# Patient Record
Sex: Male | Born: 1990 | Race: White | Hispanic: No | Marital: Single | Smoking: Current every day smoker
Health system: Southern US, Community
[De-identification: ages and names within clinical notes are randomized; demographics above are authoritative.]

## PROBLEM LIST (undated history)

## (undated) DIAGNOSIS — F909 Attention-deficit hyperactivity disorder, unspecified type: Secondary | ICD-10-CM

## (undated) HISTORY — PX: APPENDECTOMY: SHX54

---
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
## 2014-02-17 ENCOUNTER — Emergency Department (HOSPITAL_COMMUNITY)
Admission: EM | Admit: 2014-02-17 | Discharge: 2014-02-17 | Disposition: A | Discharge status: Home / Self Care | Payer: Self-pay | Attending: Emergency Medicine | Admitting: Emergency Medicine

## 2014-02-17 ENCOUNTER — Encounter (HOSPITAL_COMMUNITY): Payer: Self-pay | Admitting: Emergency Medicine

## 2014-02-17 DIAGNOSIS — K047 Periapical abscess without sinus: Secondary | ICD-10-CM

## 2014-02-17 DIAGNOSIS — K0381 Cracked tooth: Secondary | ICD-10-CM | POA: Insufficient documentation

## 2014-02-17 DIAGNOSIS — Z720 Tobacco use: Secondary | ICD-10-CM | POA: Insufficient documentation

## 2014-02-17 MED ORDER — OXYCODONE-ACETAMINOPHEN 5-325 MG PO TABS
1.0000 | ORAL_TABLET | Freq: Once | ORAL | Status: AC
  Administered 2014-02-17: 1 via ORAL
  Filled 2014-02-17: qty 1

## 2014-02-17 MED ORDER — IBUPROFEN 800 MG PO TABS
800.0000 mg | ORAL_TABLET | Freq: Once | ORAL | Status: AC
Start: 2014-02-17 — End: 2014-02-17
  Administered 2014-02-17: 800 mg via ORAL
  Filled 2014-02-17: qty 1

## 2014-02-17 MED ORDER — CLINDAMYCIN PHOSPHATE 600 MG/50ML IV SOLN
600.0000 mg | Freq: Once | INTRAVENOUS | Status: AC
  Administered 2014-02-17: 600 mg via INTRAVENOUS
  Filled 2014-02-17: qty 50

## 2014-02-17 MED ORDER — CLINDAMYCIN HCL 300 MG PO CAPS: 300.0000 mg | ORAL_CAPSULE | Freq: Four times a day (QID) | ORAL | Status: DC

## 2014-02-17 MED ORDER — CLINDAMYCIN PHOSPHATE 300 MG/2ML IJ SOLN
300.0000 mg | Freq: Once | INTRAMUSCULAR | Status: DC
  Filled 2014-02-17: qty 2

## 2014-02-17 MED ORDER — OXYCODONE-ACETAMINOPHEN 5-325 MG PO TABS: 1.0000 | ORAL_TABLET | ORAL | Status: DC | PRN

## 2014-02-17 NOTE — Discharge Instructions (Signed)
Dental Abscess A dental abscess is a collection of infected fluid (pus) from a bacterial infection in the inner part of the tooth (pulp). It usually occurs at the end of the tooth's root.  CAUSES   Severe tooth decay.  Trauma to the tooth that allows bacteria to enter into the pulp, such as a broken or chipped tooth. SYMPTOMS   Severe pain in and around the infected tooth.  Swelling and redness around the abscessed tooth or in the mouth or face.  Tenderness.  Pus drainage.  Bad breath.  Bitter taste in the mouth.  Difficulty swallowing.  Difficulty opening the mouth.  Nausea.  Vomiting.  Chills.  Swollen neck glands. DIAGNOSIS   A medical and dental history will be taken.  An examination will be performed by tapping on the abscessed tooth.  X-rays may be taken of the tooth to identify the abscess. TREATMENT The goal of treatment is to eliminate the infection. You may be prescribed antibiotic medicine to stop the infection from spreading. A root canal may be performed to save the tooth. If the tooth cannot be saved, it may be pulled (extracted) and the abscess may be drained.  HOME CARE INSTRUCTIONS  Only take over-the-counter or prescription medicines for pain, fever, or discomfort as directed by your caregiver.  Rinse your mouth (gargle) often with salt water ( tsp salt in 8 oz [250 ml] of warm water) to relieve pain or swelling.  Do not drive after taking pain medicine (narcotics).  Do not apply heat to the outside of your face.  Return to your dentist for further treatment as directed. SEEK MEDICAL CARE IF:  Your pain is not helped by medicine.  Your pain is getting worse instead of better. SEEK IMMEDIATE MEDICAL CARE IF:  You have a fever or persistent symptoms for more than 2-3 days.  You have a fever and your symptoms suddenly get worse.  You have chills or a very bad headache.  You have problems breathing or swallowing.  You have swelling in  the neck or around the eye. Document Released: 04/30/2005 Document Revised: 01/23/2012 Document Reviewed: 08/08/2010 [facility] Patient Information 2015 [place], [place]. This information is not intended to replace advice given to you by your health care provider. Make sure you discuss any questions you have with your health care provider.    Emergency Department Resource Guide 1) Find a Doctor and Pay Out of Pocket Although you won't have to find out who is covered by your insurance plan, it is a good idea to ask around and get recommendations. You will then need to call the office and see if the doctor you have chosen will accept you as a new patient and what types of options they offer for patients who are self-pay. Some doctors offer discounts or will set up payment plans for their patients who do not have insurance, but you will need to ask so you aren't surprised when you get to your appointment.  2) Contact Your Local Health Department Not all health departments have doctors that can see patients for sick visits, but many do, so it is worth a call to see if yours does. If you don't know where your local health department is, you can check in your phone book. The CDC also has a tool to help you locate your state's health department, and many state websites also have listings of all of their local health departments.  3) Find a Walk-in Clinic If your illness is not  likely to be very severe or complicated, you may want to try a walk in clinic. These are popping up all over the country in pharmacies, drugstores, and shopping centers. They're usually staffed by nurse practitioners or physician assistants that have been trained to treat common illnesses and complaints. They're usually fairly quick and inexpensive. However, if you have serious medical issues or chronic medical problems, these are probably not your best option.  No Primary Care Doctor: - Call Health Connect at  [phone] - they can help  you locate a primary care doctor that  accepts your insurance, provides certain services, etc. - Physician Referral Service- [phone]  Chronic Pain Problems: Organization         Address  Phone   Notes  [facility]  [phone] Patients need to be referred by their primary care doctor.   Medication Assistance: Organization         Address  Phone   Notes  [facility] Medication [facility] [address] [place], [place] [address] [phone] --Must be a resident of [facility] -- Must have NO insurance coverage whatsoever (no Medicaid/ Medicare, etc.) -- The pt. MUST have a primary care doctor that directs their care regularly and follows them in the community   MedAssist  [phone]   [employer]  [phone]    Agencies that provide inexpensive medical care: Organization         Address  Phone   Notes  [facility]  [phone]   [facility]    [phone]   [facility] [address] [place], [place] [address] [phone]   [facility] [address] [place] [place] [phone]   Planned Parenthood    [phone]   [facility]    [phone]   [facility]  [address], [place] Phone:  [phone], Fax:  [phone] Hours of Operation:  9 am - 6 pm, M-F.  Also accepts Medicaid/Medicare and self-pay.  [facility]  [address], [place] Phone: [phone], Fax: [phone]. Hours of Operation:  8:30 am - 5:30 pm, M-F.  Also accepts Medicaid and self-pay.  [facility] [place] [address], [place] Phone: [phone]   [facility] [address] Natasha Bence [place], [place] [phone], Ext. [phone] Mondays & Thursdays: 7-9 AM.  First 15 patients are seen on a first come, first serve basis.    Medicaid-accepting [facility] Providers:  Organization         Address  Phone   Notes  [facility] [address], [place] [phone] Also accepts self-pay patients.  [facility] [address] Laurell Josephs [place], [place]  [phone]   [facility] [address], [place] [phone]   [facility] [address] [place] [phone]   Renaye Rakers [address], [place]   [phone] Only accepts [place] Access [place] patients after they have their name applied to their card.   Self-Pay (no insurance) in [facility]:  Organization         Address  Phone   Notes  Sickle Cell Patients, [facility] [address] [place], [place] [phone]   Redge Gainer  [facility] [address], [place] [phone]) [phone]   [facility] [place]  [address], [place] [phone]   [facility]/Dr. Osei-Bonsu  [address] [place] [address], [place] or [phone], [place] [phone] Phone number for both [place] and [place] locations is the same.  [facility] [address], [place] [phone]   [facility] [address] [place] [address], [place] or [address] [phone]   [facility] [address], [place] [phone]) [phone], phone; [phone]) [phone], fax Sees patients 1st and 3rd Saturday of every month.  Must not qualify for public or private insurance (i.e. Medicaid, Medicare, [place] Health Choice, Veterans' Benefits)  Household income should be no more than 200% of the poverty level The clinic cannot treat you if you are pregnant or think you are pregnant  Sexually transmitted diseases are not treated at the clinic.    Dental Care: Organization         Address  Phone  Notes  [facility]  [facility] [address], [place] [phone]) [phone] Accepts children up to age 421 who are enrolled in [place] or [place] Health Choice; pregnant women with a Medicaid card; and children who have applied for Medicaid or [place] Health Choice, but were declined, whose parents can pay a reduced fee at time of service.  [facility] [place]  [address], [place] [phone]) [phone] Accepts children up to age 21 who are enrolled in [place] or [place] Health Choice; pregnant women with a Medicaid card; and children who have applied for Medicaid or [place] Health Choice, but were declined, whose parents can pay a reduced fee at time of service.  [facility]  [address], [place] [phone]) [phone] Patients are seen by appointment only. Walk-ins are not accepted. [facility] will see patients 10018 years of age and older. Monday - Tuesday (8am-5pm) Most Wednesdays (8:30-5pm) $30 per visit, cash only  [facility]  [address], [place]construct[place] [phone]) [phone] Patients are seen by appointment only. Walk-ins are not accepted. [facility] will see patients 21 years of age and older. One Wednesday Evening (Monthly: Volunteer Based).  $30 per visit, cash only  [employer] [facility] [facility]  [phone] for adults; Children under age 684, call Graduate Pediatric Dentistry at [phone]. Children aged 94-14, please call [phone] to request a pediatric application.  Dental services are provided in all areas of dental care including fillings, crowns and bridges, complete and partial dentures, implants, gum treatment, root canals, and extractions. Preventive care is also provided. Treatment is provided to both adults and children. Patients are selected via a lottery and there is often a waiting list.   [facility] [address], [place]  [phone] [email or web address]   [facility] [address], [place], [place] [phone], Ext. [phone] Second and Fourth Thursday of each month, opens at 6:30 AM; Clinic ends at 9 AM.  Patients are seen on a first-come first-served basis, and a limited number are seen during each clinic.   [facility]  [address], [place], [place] [phone]   Eligibility Requirements You must have lived in [place], [place], or [place] counties for at least the last three  months.   You cannot be eligible for state or federal sponsored [place], including [employer], [place], or [employer].   You generally cannot be eligible for healthcare insurance through your employer.    How to apply: Eligibility screenings are held every Tuesday and Wednesday afternoon from 1:00 pm until 4:00 pm. You do not need an appointment for the interview!  [facility] [address] [place], [place] [phone]   [facility]  [phone]   [facility] Health Department  [phone]   [facility] Health Department  [phone]    Behavioral Health Resources in the Community: Intensive Outpatient Programs Organization         Address  Phone  Notes  [facility] [address], [place], [place] [phone]   [facility]patient [address], [place], [place] [phone]   [facility] [address] [place], [place]  [phone]   [facility] [address],  [place], [place] [phone] or [phone]   Substance Abuse Resources Organization         Address  Phone  Notes  Al[facility] Drug Services  [phone]   [facility]  [phone]   [facility]  [phone]   Floydene Flock  [phone]   Residential & Outpatient Substance Abuse Program  2104669322   Psychological Services Organization         Address  Phone  Notes  [facility] [place] [facility]  [phone]   [facility]  [phone]   [facility] [address] [place] [phone] or [phone]    Mobile Crisis Teams Organization         Address  Phone  Notes  [facility], Mobile Crisis Care Unit  [phone]   [facility]  [address] [place], [place] [phone]   Doristine Locks [address] [place] [phone]    Self-Help/Support Groups Organization         Address  Phone             Notes  [facility] [place] - variety of support groups  336- I7437963 Call for more information  Narcotics Anonymous (NA), [facility] [address], [employer] [place]  2 meetings at this location   Statistician         Address  Phone  Notes  [facility] [address],    [place]  [phone]   [facility]  [address], [place] [address], [place], [place] [phone]   [facility] [address] [place], [place] [phone] Admissions: 8am-3pm M-F  [facility] [address]    [place], [place] [phone]   [facility] [address] [place], [place], [place] [phone]   [facility] [address],  [place], [place] [phone]   [facility] - Intensive Outpatient [address] Laurell Josephs [address], [place], [place] [phone]   [facility] [address] [place].,  [place], [place] [phone] or [phone]   [facility] ([facility]) [address] [place], [place] [phone] Accepts Medicaid  [facility] [address]  [place] [phone] Substance Abuse/Addiction Treatment   [facility] Organization         Address  Phone  Notes  [facility]  [phone]   Angie Fava, PhD  [address] [place]   [phone] or  [phone]   [facility]   [address] [place], [place] [phone]   [facility] [address] [place], [place] [phone] Insurance/Medicaid/sponsorship through [facility] [address]                                    [place], [place] [phone] Therapy/tele-psych/case  [facility] [address]   [place], [place] [phone]    Dr. Lolly Mustache  [phone]   [facility]  [employer] [facility] Dept. 1) [address] [place] 2) [address] [place] 3)  [address], [place] [phone]  [phone]   [facility] Child Abuse Hotline 434 288 1468 or [phone] (After Hours)         Complete your entire course of antibiotics as prescribed.  You  may use the oxycodone for pain relief but do not drive within 4 hours of taking as this will make you drowsy.  Avoid applying heat or ice to this abscess area which can worsen your symptoms.  You may use warm salt water swish and spit treatment or half peroxide and water swish and spit after meals to keep this area clean as discussed.

## 2014-02-17 NOTE — ED Notes (Signed)
Pt notice swolling of upper gums and pain, on left side, yesterday. Today face is swollen, some difficulty with eating, denies, any problems breathing or swallowing.

## 2014-02-18 NOTE — ED Provider Notes (Signed)
CSN: [id number]     Arrival date & time 02/17/14  1448 History   First MD Initiated Contact with Patient 02/17/14 1617     Chief Complaint  Patient presents with  . Dental Pain  . Facial Swelling     (Consider location/radiation/quality/duration/timing/severity/associated sxs/prior Treatment) The history is provided by the patient.   Murrell ConverseDustin G Bala is a 23 y.o. male presenting with dental pain and swelling.  He has had a fractured tooth for at least some months which has started causing pain yesterday.  The pain and swelling radiates along the left side of his nose and is constant and aching.  He has taken tylenol without relief of pain.  He denies fevers or chills, nausea or myalgias.  He has been able to eat but with some discomfort.     History reviewed. No pertinent past medical history. Past Surgical History  Procedure Laterality Date  . Appendectomy     History reviewed. No pertinent family history. History  Substance Use Topics  . Smoking status: Current Every Day Smoker -- 0.50 packs/day    Types: Cigarettes  . Smokeless tobacco: Not on file  . Alcohol Use: No    Review of Systems  Constitutional: Negative for fever.  HENT: Positive for dental problem and facial swelling. Negative for congestion and sore throat.   Eyes: Negative.   Respiratory: Negative for chest tightness and shortness of breath.   Cardiovascular: Negative for chest pain.  Gastrointestinal: Negative for nausea and abdominal pain.  Genitourinary: Negative.   Musculoskeletal: Negative for arthralgias, joint swelling, neck pain and neck stiffness.  Skin: Negative.  Negative for rash and wound.  Neurological: Negative for dizziness, weakness, light-headedness, numbness and headaches.  Psychiatric/Behavioral: Negative.       Allergies  Review of patient's allergies indicates not on file.  Home Medications   Prior to Admission medications   Medication Sig Start Date End Date Taking?  Authorizing Provider  acetaminophen (PAIN RELIEVER) 325 MG tablet Take 650 mg by mouth every 6 (six) hours as needed for mild pain or moderate pain.   Yes Historical Provider, MD  doxylamine, Sleep, (SLEEP AID) 25 MG tablet Take 25 mg by mouth at bedtime as needed for sleep.   Yes Historical Provider, MD  Multiple Vitamins-Minerals (MULTIVITAMIN GUMMIES ADULT PO) Take 1-2 each by mouth daily.   Yes Historical Provider, MD  clindamycin (CLEOCIN) 300 MG capsule Take 1 capsule (300 mg total) by mouth 4 (four) times daily. 02/17/14   Burgess AmorJulie Alanda Colton, PA-C  oxyCODONE-acetaminophen (PERCOCET/ROXICET) 5-325 MG per tablet Take 1 tablet by mouth every 4 (four) hours as needed. 02/17/14   Burgess AmorJulie Kazimierz [place], PA-C   BP 112/64  Pulse 81  Temp(Src) 98 F (36.7 C) (Oral)  Resp 24  Wt 120 lb (54.432 kg)  SpO2 100% Physical Exam  Constitutional: He is oriented to person, place, and time. He appears well-developed and well-nourished. No distress.  HENT:  Head: Normocephalic and atraumatic.  Right Ear: Tympanic membrane and external ear normal.  Left Ear: Tympanic membrane and external ear normal.  Mouth/Throat: Oropharynx is clear and moist and mucous membranes are normal. No oral lesions. Dental abscesses present.    Pt with mild left upper lip induration which tracks along the left nasolabial fold.  No fluctuance.  No facial erythema.  Mild edema without induration or erythema of left lower lid.    Eyes: Conjunctivae and EOM are normal.  Neck: Normal range of motion. Neck supple.  Cardiovascular: Normal rate and  normal heart sounds.   Pulmonary/Chest: Effort normal.  Abdominal: He exhibits no distension.  Musculoskeletal: Normal range of motion.  Lymphadenopathy:    He has no cervical adenopathy.  Neurological: He is alert and oriented to person, place, and time.  Skin: Skin is warm and dry. No erythema.  Psychiatric: He has a normal mood and affect.    ED Course  Procedures (including critical care  time) Labs Review Labs Reviewed - No data to display  Imaging Review No results found.   EKG Interpretation None      MDM   Final diagnoses:  Dental abscess    Pt given IV clindamycin.  Ibuprofen and oxycodone given with significant relief of pain.  He was prescribed oxy and clinda.  Advised needs dental f/u.  Referrals given.  In the interim,  Return here for any worsened sx.  Pt and mother at bedside agree and understand plan.    Burgess Amor, PA-C 02/18/14 1535

## 2014-02-18 NOTE — ED Provider Notes (Signed)
Medical screening examination/treatment/procedure(s) were performed by non-physician practitioner and as supervising physician I was immediately available for consultation/collaboration.   EKG Interpretation None        Ariana Juul L Roxann Vierra, MD 02/18/14 1539 

## 2015-12-22 ENCOUNTER — Emergency Department (HOSPITAL_COMMUNITY)
Admission: EM | Admit: 2015-12-22 | Discharge: 2015-12-22 | Disposition: A | Discharge status: Home / Self Care | Payer: Self-pay | Attending: Emergency Medicine | Admitting: Emergency Medicine

## 2015-12-22 ENCOUNTER — Encounter (HOSPITAL_COMMUNITY): Payer: Self-pay | Admitting: Emergency Medicine

## 2015-12-22 DIAGNOSIS — Z79899 Other long term (current) drug therapy: Secondary | ICD-10-CM | POA: Insufficient documentation

## 2015-12-22 DIAGNOSIS — K0889 Other specified disorders of teeth and supporting structures: Secondary | ICD-10-CM

## 2015-12-22 DIAGNOSIS — K047 Periapical abscess without sinus: Secondary | ICD-10-CM | POA: Insufficient documentation

## 2015-12-22 DIAGNOSIS — F17210 Nicotine dependence, cigarettes, uncomplicated: Secondary | ICD-10-CM | POA: Insufficient documentation

## 2015-12-22 MED ORDER — PENICILLIN V POTASSIUM 500 MG PO TABS: 500.0000 mg | ORAL_TABLET | Freq: Four times a day (QID) | ORAL | 0 refills | Status: AC

## 2015-12-22 NOTE — ED Notes (Signed)
Pt comes in today w/ complaints of L. Sided dental pain. Per pt pain started x2 days ago, and L.sided facial swelling began this morning 12/22/15. Swelling noted upon assessment. Pt denies any complications breathing, eating, or swallowing. P/t states he tried salt/water mouth rinse, Ibuprofen, and Tylenol w/ no relief. P/t AOx4, Vitals Stable. Resting w/ family at bedside.

## 2015-12-22 NOTE — ED Provider Notes (Signed)
[facility]-EMERGENCY DEPT Provider Note   CSN: [id number] Arrival date & time: 12/22/15  [id number]  First Provider Contact:  None       History   Chief Complaint Chief Complaint  Patient presents with  . Dental Pain    Left Side    HPI Scott Russo is a 25 y.o. male.  HPI   Patient is a 25 year old male with history of tobacco use who presents the emergency department with progressively worsening, constant, left upper sided jaw pain for 2 days. He is taking ibuprofen with moderate relief, pain worse with chewing or touch. Associated left-sided mild cheek swelling. Patient states previous dental infection in this area in the past. Patient endorses broken teeth in this area for over 2 years. Patient denies sore throat, difficulty swallowing, difficulty opening his mouth, fever, chills, nausea, vomiting.  History reviewed. No pertinent past medical history.  There are no active problems to display for this patient.   Past Surgical History:  Procedure Laterality Date  . APPENDECTOMY         Home Medications    Prior to Admission medications   Medication Sig Start Date End Date Taking? Authorizing Provider  acetaminophen (PAIN RELIEVER) 325 MG tablet Take 650 mg by mouth every 6 (six) hours as needed for mild pain or moderate pain.    Historical Provider, MD  doxylamine, Sleep, (SLEEP AID) 25 MG tablet Take 25 mg by mouth at bedtime as needed for sleep.    Historical Provider, MD  Multiple Vitamins-Minerals (MULTIVITAMIN GUMMIES ADULT PO) Take 1-2 each by mouth daily.    Historical Provider, MD  oxyCODONE-acetaminophen (PERCOCET/ROXICET) 5-325 MG per tablet Take 1 tablet by mouth every 4 (four) hours as needed. 02/17/14   Burgess AmorJulie Idol, PA-C  penicillin v potassium (VEETID) 500 MG tablet Take 1 tablet (500 mg total) by mouth 4 (four) times daily. 12/22/15 12/29/15  Jerre SimonJessica L Zaryah Seckel, PA    Family History History reviewed. No pertinent family history.  Social History Social History    Substance Use Topics  . Smoking status: Current Every Day Smoker    Packs/day: 0.50    Types: Cigarettes  . Smokeless tobacco: Former NeurosurgeonUser    Types: Chew    Quit date: 12/22/2010  . Alcohol use Yes     Comment: Per p/t social drinker unsure how much per week/month     Allergies   Review of patient's allergies indicates not on file.   Review of Systems Review of Systems  Constitutional: Negative for chills and fever.  HENT: Positive for dental problem. Negative for sore throat and trouble swallowing.   Gastrointestinal: Negative for abdominal pain, nausea and vomiting.  Neurological: Negative for headaches.     Physical Exam Updated Vital Signs BP 124/83 (BP Location: Left Arm)   Pulse 71   Temp 97.6 F (36.4 C) (Oral)   Resp 16   Ht 5\' 7"  (1.702 m)   Wt 56.7 kg   SpO2 100%   BMI 19.58 kg/m   Physical Exam  Constitutional: He appears well-developed and well-nourished. No distress.  HENT:  Head: Normocephalic and atraumatic.  Mouth/Throat: Uvula is midline, oropharynx is clear and moist and mucous membranes are normal. No trismus in the jaw. Abnormal dentition. Dental caries present. No dental abscesses or uvula swelling.  Poor dentition with numerous broken teeth and numerous cavities, broken upper left canine, first premolar, second premolar, erythema surrounding gingiva, mild edema, no area of fluctuance or drainable abscess, mild TTP, no trismus, no  sublingual swelling.  Eyes: Conjunctivae are normal.  Pulmonary/Chest: Effort normal. No respiratory distress.  Musculoskeletal: Normal range of motion.  Neurological: He is alert. Coordination normal.  Skin: Skin is warm and dry. He is not diaphoretic.  Psychiatric: He has a normal mood and affect. His behavior is normal.  Nursing note and vitals reviewed.    ED Treatments / Results  Labs (all labs ordered are listed, but only abnormal results are displayed) Labs Reviewed - No data to display  EKG  EKG  Interpretation None       Radiology No results found.  Procedures Procedures (including critical care time)  Medications Ordered in ED Medications - No data to display   Initial Impression / Assessment and Plan / ED Course  I have reviewed the triage vital signs and the nursing notes.  Pertinent labs & imaging results that were available during my care of the patient were reviewed by me and considered in my medical decision making (see chart for details).  Clinical Course   Patient with dentalgia.  No abscess requiring immediate incision and drainage.  Exam not concerning for Ludwig's angina or pharyngeal abscess.  Will treat with Penicillin. Pt instructed to follow-up with dentist.  Discussed return precautions. Pt safe for discharge and expressed understanding to the discharge instructions.   Final Clinical Impressions(s) / ED Diagnoses   Final diagnoses:  Pain, dental  Dental infection    New Prescriptions New Prescriptions   PENICILLIN V POTASSIUM (VEETID) 500 MG TABLET    Take 1 tablet (500 mg total) by mouth 4 (four) times daily.     Jerre Simon, PA 12/22/15 1054    Marily Memos, MD 12/24/15 [id number]

## 2015-12-22 NOTE — Discharge Instructions (Signed)
Take the penicillin as prescribed, be sure to complete the entire course, eat with this medication. If you experience rash or diarrhea discontinued this medication and return immediately to the emergency department. Follow-up with Dr. Mayford Knifeurner, dentist, to have your teeth reevaluated.  Return to emergency department if you experience worsening pain, worsening swelling, difficulty opening your mouth, fever, chills, vomiting, or any other concerning symptoms.

## 2015-12-22 NOTE — ED Notes (Signed)
Pt and Family given information on "[facility]". Verbalized understanding.

## 2017-07-17 ENCOUNTER — Other Ambulatory Visit: Payer: Self-pay

## 2017-07-17 ENCOUNTER — Encounter (HOSPITAL_COMMUNITY): Payer: Self-pay | Admitting: Emergency Medicine

## 2017-07-17 ENCOUNTER — Emergency Department (HOSPITAL_COMMUNITY)
Admission: EM | Admit: 2017-07-17 | Discharge: 2017-07-17 | Disposition: A | Discharge status: Home / Self Care | Payer: Self-pay | Attending: Emergency Medicine | Admitting: Emergency Medicine

## 2017-07-17 DIAGNOSIS — R42 Dizziness and giddiness: Secondary | ICD-10-CM | POA: Insufficient documentation

## 2017-07-17 DIAGNOSIS — F17210 Nicotine dependence, cigarettes, uncomplicated: Secondary | ICD-10-CM | POA: Insufficient documentation

## 2017-07-17 DIAGNOSIS — R1013 Epigastric pain: Secondary | ICD-10-CM

## 2017-07-17 DIAGNOSIS — R6883 Chills (without fever): Secondary | ICD-10-CM | POA: Insufficient documentation

## 2017-07-17 HISTORY — DX: Attention-deficit hyperactivity disorder, unspecified type: F90.9

## 2017-07-17 LAB — COMPREHENSIVE METABOLIC PANEL
ALK PHOS: 63 U/L (ref 38–126)
ALT: 17 U/L (ref 17–63)
ANION GAP: 13 (ref 5–15)
AST: 17 U/L (ref 15–41)
Albumin: 4.8 g/dL (ref 3.5–5.0)
BUN: 11 mg/dL (ref 6–20)
CALCIUM: 9.6 mg/dL (ref 8.9–10.3)
CO2: 21 mmol/L — AB (ref 22–32)
CREATININE: 0.79 mg/dL (ref 0.61–1.24)
Chloride: 104 mmol/L (ref 101–111)
GFR calc Af Amer: >60 mL/min (ref >60)
GFR calc non Af Amer: >60 mL/min (ref >60)
Glucose, Bld: 92 mg/dL (ref 65–99)
Potassium: 3.8 mmol/L (ref 3.5–5.1)
SODIUM: 138 mmol/L (ref 135–145)
Total Bilirubin: 1 mg/dL (ref 0.3–1.2)
Total Protein: 8.3 g/dL — ABNORMAL HIGH (ref 6.5–8.1)

## 2017-07-17 LAB — CBC
HCT: 43.4 % (ref 39.0–52.0)
HEMOGLOBIN: 14.9 g/dL (ref 13.0–17.0)
MCH: 30.7 pg (ref 26.0–34.0)
MCHC: 34.3 g/dL (ref 30.0–36.0)
MCV: 89.3 fL (ref 78.0–100.0)
Platelets: 200 10*3/uL (ref 150–400)
RBC: 4.86 MIL/uL (ref 4.22–5.81)
RDW: 12.9 % (ref 11.5–15.5)
WBC: 7.3 10*3/uL (ref 4.0–10.5)

## 2017-07-17 LAB — TYPE AND SCREEN
ABO/RH(D): A POS
Antibody Screen: NEGATIVE

## 2017-07-17 LAB — URINALYSIS, ROUTINE W REFLEX MICROSCOPIC
Bilirubin Urine: NEGATIVE
Glucose, UA: NEGATIVE mg/dL
HGB URINE DIPSTICK: NEGATIVE
Ketones, ur: NEGATIVE mg/dL
Leukocytes, UA: NEGATIVE
Nitrite: NEGATIVE
Protein, ur: NEGATIVE mg/dL
SPECIFIC GRAVITY, URINE: 1.015 (ref 1.005–1.030)
pH: 8 (ref 5.0–8.0)

## 2017-07-17 LAB — LIPASE, BLOOD: Lipase: 26 U/L (ref 11–51)

## 2017-07-17 MED ORDER — FAMOTIDINE 40 MG PO TABS: 40.0000 mg | ORAL_TABLET | Freq: Every day | ORAL | 1 refills | Status: AC

## 2017-07-17 MED ORDER — FAMOTIDINE 20 MG PO TABS
40.0000 mg | ORAL_TABLET | Freq: Once | ORAL | Status: AC
  Administered 2017-07-17: 40 mg via ORAL
  Filled 2017-07-17: qty 2

## 2017-07-17 NOTE — ED Triage Notes (Signed)
Pt reports generalized abdominal pain with nausea and black stools for 5 days.

## 2017-07-17 NOTE — ED Provider Notes (Signed)
[facility] EMERGENCY DEPARTMENT Provider Note   CSN: [id number] Arrival date & time: 07/17/17  1349     History   Chief Complaint Chief Complaint  Patient presents with  . Abdominal Pain    HPI Scott Russo is a 27 y.o. male.  Patient with a complaint of epigastric abdominal pain that started on Saturday.  Also at that time had some dark stools.  Later on Saturday patient started taking Pepto-Bismol.  Dark stools continued.  No abdominal pain on Sunday.  Occasionally has some discomfort with eating.  No vomiting.  No red blood in bowel movements.  Abdominal pain is intermittent but made a little worse with food.  Patient never had any symptoms like this before.  In addition patient's had some chills.  On the first day had a little bit of lightheadedness but none since.      Past Medical History:  Diagnosis Date  . ADHD     There are no active problems to display for this patient.   Past Surgical History:  Procedure Laterality Date  . APPENDECTOMY         Home Medications    Prior to Admission medications   Medication Sig Start Date End Date Taking? Authorizing Provider  famotidine (PEPCID) 40 MG tablet Take 1 tablet (40 mg total) by mouth daily. 07/17/17   Vanetta Mulders, MD    Family History No family history on file.  Social History Social History   Tobacco Use  . Smoking status: Current Every Day Smoker    Packs/day: 0.50    Types: Cigarettes  . Smokeless tobacco: Former Neurosurgeon    Types: Chew    Quit date: 12/22/2010  Substance Use Topics  . Alcohol use: Yes    Comment: Per p/t social drinker unsure how much per week/month  . Drug use: Yes    Types: Marijuana    Comment: last use 07/16/17     Allergies   Patient has no known allergies.   Review of Systems Review of Systems  Constitutional: Positive for chills. Negative for fever.  HENT: Negative for congestion.   Eyes: Negative for redness.  Respiratory: Negative for shortness of  breath.   Cardiovascular: Negative for chest pain.  Gastrointestinal: Positive for abdominal pain. Negative for blood in stool, diarrhea, nausea and vomiting.  Genitourinary: Negative for dysuria and hematuria.  Musculoskeletal: Negative for back pain.  Skin: Negative for rash.  Neurological: Positive for light-headedness. Negative for syncope.  Hematological: Does not bruise/bleed easily.  Psychiatric/Behavioral: Negative for confusion.     Physical Exam Updated Vital Signs BP 117/79   Pulse 76   Temp 97.8 F (36.6 C) (Oral)   Resp 17   Ht 1.702 m (5\' 7" )   Wt 56.7 kg (125 lb)   SpO2 100%   BMI 19.58 kg/m   Physical Exam  Constitutional: He is oriented to person, place, and time. He appears well-nourished. No distress.  HENT:  Head: Normocephalic and atraumatic.  Mouth/Throat: Oropharynx is clear and moist.  Eyes: Conjunctivae and EOM are normal. Pupils are equal, round, and reactive to light.  Neck: Neck supple.  Cardiovascular: Normal rate and regular rhythm.  Pulmonary/Chest: Effort normal and breath sounds normal.  Abdominal: Soft. Bowel sounds are normal. There is no tenderness.  No tenderness to palpation epigastric area or right upper quadrant.  Musculoskeletal: Normal range of motion. He exhibits no edema.  Neurological: He is alert and oriented to person, place, and time. No cranial nerve deficit  or sensory deficit. He exhibits normal muscle tone. Coordination normal.  Skin: Skin is warm.  Nursing note and vitals reviewed.    ED Treatments / Results  Labs (all labs ordered are listed, but only abnormal results are displayed) Labs Reviewed  COMPREHENSIVE METABOLIC PANEL - Abnormal; Notable for the following components:      Result Value   CO2 21 (*)    Total Protein 8.3 (*)    All other components within normal limits  URINALYSIS, ROUTINE W REFLEX MICROSCOPIC - Abnormal; Notable for the following components:   APPearance CLOUDY (*)    All other  components within normal limits  LIPASE, BLOOD  CBC  POC OCCULT BLOOD, ED  TYPE AND SCREEN    EKG  EKG Interpretation None       Radiology No results found.  Procedures Procedures (including critical care time)  Medications Ordered in ED Medications  famotidine (PEPCID) tablet 40 mg (40 mg Oral Given 07/17/17 2104)     Initial Impression / Assessment and Plan / ED Course  I have reviewed the triage vital signs and the nursing notes.  Pertinent labs & imaging results that were available during my care of the patient were reviewed by me and considered in my medical decision making (see chart for details).    GI labs without any significant abnormalities.  Patient's symptoms particularly with probable dark stools prior to the Pepto-Bismol concerning for possible peptic ulcer and perhaps with melena.  Patient has been on Pepto-Bismol since then.  Never had any red blood.  The epigastric abdominal pain is intermittent.  Was worse on Saturday.  Today's labs without any significant abnormal is.  It is still possible the symptoms could be gallbladder related.  Recommending treatment with Pepcid first dose given here tonight follow-up with GI medicine for concern for possible upper endoscopy.  Patient will return for any new or worse symptoms.  Patient's vital signs are normal labs including hemoglobin hematocrit without any significant abnormalities.  Patient without any symptoms currently.   Final Clinical Impressions(s) / ED Diagnoses   Final diagnoses:  Epigastric pain    ED Discharge Orders        Ordered    famotidine (PEPCID) 40 MG tablet  Daily     07/17/17 2125       Vanetta MuldersZackowski, Breaunna Gottlieb, MD 07/17/17 2139

## 2017-07-17 NOTE — Discharge Instructions (Signed)
Take the Pepcid as directed for the next 2 weeks.  Make an appointment to follow-up with gastroenterology.  Return for any new or worse symptoms.  To include vomiting red blood or pool of red blood with bowel movements.  Symptoms may be consistent with peptic ulcer disease.

## 2017-07-17 NOTE — ED Notes (Signed)
Pt given water, crackers, and peanut butter per request. Okay per Dr Deretha EmoryZackowski

## 2017-11-06 ENCOUNTER — Other Ambulatory Visit: Payer: Self-pay

## 2017-11-06 ENCOUNTER — Encounter (HOSPITAL_COMMUNITY): Payer: Self-pay | Admitting: Emergency Medicine

## 2017-11-06 ENCOUNTER — Emergency Department (HOSPITAL_COMMUNITY)
Admission: EM | Admit: 2017-11-06 | Discharge: 2017-11-06 | Disposition: A | Discharge status: Home / Self Care | Payer: Self-pay | Attending: Emergency Medicine | Admitting: Emergency Medicine

## 2017-11-06 DIAGNOSIS — K529 Noninfective gastroenteritis and colitis, unspecified: Secondary | ICD-10-CM | POA: Insufficient documentation

## 2017-11-06 DIAGNOSIS — F17210 Nicotine dependence, cigarettes, uncomplicated: Secondary | ICD-10-CM | POA: Insufficient documentation

## 2017-11-06 MED ORDER — ONDANSETRON HCL 4 MG PO TABS: 4.0000 mg | ORAL_TABLET | Freq: Three times a day (TID) | ORAL | 0 refills | Status: DC | PRN

## 2017-11-06 NOTE — Discharge Instructions (Addendum)
You may take the zofran medicine if needed for nausea or continued vomiting.  Make sure you are drinking plenty of fluids and get rest.  Foods that do well on an upset stomach include bananas, plain rice, applesauce and dry toast. Get rechecked for any worsened symptoms.  You may also take the pepto bismol you have at home.

## 2017-11-06 NOTE — ED Triage Notes (Addendum)
Pt c/o of n/v/d since last night.  Pt states " I need a work note because I feel better now, I just need it for work"

## 2017-11-07 NOTE — ED Provider Notes (Signed)
[facility] EMERGENCY DEPARTMENT Provider Note   CSN: [id number] Arrival date & time: 11/06/17  1456     History   Chief Complaint Chief Complaint  Patient presents with  . Letter for School/Work    HPI Scott Russo is a 27 y.o. male, who works at [employer], requesting a work note so he can return to work.  He reports a 2 day history of nausea, emesis x 2 yesterday) and diarrhea, describing a  Small amount of nonbloody loose stool with oral intake.  He has been able to tolerate fluid intake today without emesis and denies any decreased urine production, also denies fevers, dizziness or weakness at this time.  His mother with whom he lives had similar sx this weekend lasting 2 days, now improved. He has pepto bismol at home but has yet to take a dose.  He simply desires a work note.  The history is provided by the patient.    Past Medical History:  Diagnosis Date  . ADHD     There are no active problems to display for this patient.   Past Surgical History:  Procedure Laterality Date  . APPENDECTOMY          Home Medications    Prior to Admission medications   Medication Sig Start Date End Date Taking? Authorizing Provider  famotidine (PEPCID) 40 MG tablet Take 1 tablet (40 mg total) by mouth daily. 07/17/17   Vanetta MuldersZackowski, Scott, MD  ondansetron (ZOFRAN) 4 MG tablet Take 1 tablet (4 mg total) by mouth every 8 (eight) hours as needed for nausea or vomiting. 11/06/17   Burgess AmorIdol, Harleyquinn Gasser, PA-C    Family History History reviewed. No pertinent family history.  Social History Social History   Tobacco Use  . Smoking status: Current Every Day Smoker    Packs/day: 0.50    Types: Cigarettes  . Smokeless tobacco: Former NeurosurgeonUser    Types: Chew    Quit date: 12/22/2010  Substance Use Topics  . Alcohol use: Yes    Comment: Per p/t social drinker unsure how much per week/month  . Drug use: Yes    Types: Marijuana    Comment: last use 07/16/17     Allergies   Patient  has no known allergies.   Review of Systems Review of Systems  Constitutional: Negative for fatigue and fever.  HENT: Negative for congestion and sore throat.   Eyes: Negative.   Respiratory: Negative for chest tightness and shortness of breath.   Cardiovascular: Negative for chest pain.  Gastrointestinal: Positive for diarrhea, nausea and vomiting. Negative for abdominal pain.  Genitourinary: Negative.  Negative for decreased urine volume and difficulty urinating.  Musculoskeletal: Negative for arthralgias, joint swelling and neck pain.  Skin: Negative.  Negative for rash and wound.  Neurological: Negative for dizziness, weakness, light-headedness, numbness and headaches.  Psychiatric/Behavioral: Negative.      Physical Exam Updated Vital Signs BP 104/63 (BP Location: Right Arm)   Pulse (!) 58   Temp 97.9 F (36.6 C) (Oral)   Resp 14   Ht 5\' 7"  (1.702 m)   Wt 57.4 kg (126 lb 9.6 oz)   SpO2 100%   BMI 19.83 kg/m   Physical Exam  Constitutional: He appears well-developed and well-nourished.  HENT:  Head: Normocephalic and atraumatic.  Mouth/Throat: Oropharynx is clear and moist.  Eyes: Conjunctivae are normal.  Neck: Normal range of motion.  Cardiovascular: Normal rate, regular rhythm, normal heart sounds and intact distal pulses.  Pulmonary/Chest: Effort  normal and breath sounds normal. He has no wheezes.  Abdominal: Soft. Bowel sounds are normal. He exhibits no distension and no mass. There is no tenderness. There is no guarding.  Musculoskeletal: Normal range of motion.  Neurological: He is alert.  Skin: Skin is warm and dry.  Psychiatric: He has a normal mood and affect.  Nursing note and vitals reviewed.    ED Treatments / Results  Labs (all labs ordered are listed, but only abnormal results are displayed) Labs Reviewed - No data to display  EKG None  Radiology No results found.  Procedures Procedures (including critical care time)  Medications  Ordered in ED Medications - No data to display   Initial Impression / Assessment and Plan / ED Course  I have reviewed the triage vital signs and the nursing notes.  Pertinent labs & imaging results that were available during my care of the patient were reviewed by me and considered in my medical decision making (see chart for details).     Exam and vital signs normal, no evidence for dehydration, benign abdominal exam. Hx and exam suggesting benign viral GI bug. He felt improved today and has been tolerating PO fluids.  He was given zofran prepack if nausea persists, discussed increased fluid intake, BRAT diet. Prn f/u.  Pt felt ready to return to work, note given.  Final Clinical Impressions(s) / ED Diagnoses   Final diagnoses:  Gastroenteritis    ED Discharge Orders        Ordered    ondansetron (ZOFRAN) 4 MG tablet  Every 8 hours PRN     11/06/17 1636       Burgess Amor, PA-C 11/07/17 0830    Long, Arlyss Repress, MD 11/07/17 1313

## 2017-11-13 ENCOUNTER — Encounter (HOSPITAL_COMMUNITY): Payer: Self-pay | Admitting: Emergency Medicine

## 2017-11-13 ENCOUNTER — Emergency Department (HOSPITAL_COMMUNITY): Payer: Self-pay

## 2017-11-13 ENCOUNTER — Other Ambulatory Visit: Payer: Self-pay

## 2017-11-13 ENCOUNTER — Emergency Department (HOSPITAL_COMMUNITY)
Admission: EM | Admit: 2017-11-13 | Discharge: 2017-11-13 | Disposition: A | Discharge status: Home / Self Care | Payer: Self-pay | Attending: Emergency Medicine | Admitting: Emergency Medicine

## 2017-11-13 DIAGNOSIS — R197 Diarrhea, unspecified: Secondary | ICD-10-CM | POA: Insufficient documentation

## 2017-11-13 DIAGNOSIS — F909 Attention-deficit hyperactivity disorder, unspecified type: Secondary | ICD-10-CM | POA: Insufficient documentation

## 2017-11-13 DIAGNOSIS — R112 Nausea with vomiting, unspecified: Secondary | ICD-10-CM | POA: Insufficient documentation

## 2017-11-13 DIAGNOSIS — Z79899 Other long term (current) drug therapy: Secondary | ICD-10-CM | POA: Insufficient documentation

## 2017-11-13 DIAGNOSIS — R109 Unspecified abdominal pain: Secondary | ICD-10-CM | POA: Insufficient documentation

## 2017-11-13 DIAGNOSIS — F17210 Nicotine dependence, cigarettes, uncomplicated: Secondary | ICD-10-CM | POA: Insufficient documentation

## 2017-11-13 LAB — COMPREHENSIVE METABOLIC PANEL
ALT: 14 U/L (ref 0–44)
AST: 15 U/L (ref 15–41)
Albumin: 4.4 g/dL (ref 3.5–5.0)
Alkaline Phosphatase: 52 U/L (ref 38–126)
Anion gap: 8 (ref 5–15)
BILIRUBIN TOTAL: 1.2 mg/dL (ref 0.3–1.2)
BUN: 13 mg/dL (ref 6–20)
CHLORIDE: 107 mmol/L (ref 98–111)
CO2: 23 mmol/L (ref 22–32)
CREATININE: 0.8 mg/dL (ref 0.61–1.24)
Calcium: 9.3 mg/dL (ref 8.9–10.3)
Glucose, Bld: 95 mg/dL (ref 70–99)
POTASSIUM: 3.8 mmol/L (ref 3.5–5.1)
Sodium: 138 mmol/L (ref 135–145)
TOTAL PROTEIN: 7.7 g/dL (ref 6.5–8.1)

## 2017-11-13 LAB — URINALYSIS, ROUTINE W REFLEX MICROSCOPIC
Bilirubin Urine: NEGATIVE
Glucose, UA: NEGATIVE mg/dL
Hgb urine dipstick: NEGATIVE
Ketones, ur: NEGATIVE mg/dL
LEUKOCYTES UA: NEGATIVE
NITRITE: NEGATIVE
PROTEIN: NEGATIVE mg/dL
Specific Gravity, Urine: 1.018 (ref 1.005–1.030)
pH: 8 (ref 5.0–8.0)

## 2017-11-13 LAB — CBC
HEMATOCRIT: 41.7 % (ref 39.0–52.0)
Hemoglobin: 14.5 g/dL (ref 13.0–17.0)
MCH: 31.5 pg (ref 26.0–34.0)
MCHC: 34.8 g/dL (ref 30.0–36.0)
MCV: 90.7 fL (ref 78.0–100.0)
PLATELETS: 200 10*3/uL (ref 150–400)
RBC: 4.6 MIL/uL (ref 4.22–5.81)
RDW: 12.9 % (ref 11.5–15.5)
WBC: 5.5 10*3/uL (ref 4.0–10.5)

## 2017-11-13 LAB — LIPASE, BLOOD: LIPASE: 28 U/L (ref 11–51)

## 2017-11-13 MED ORDER — IOPAMIDOL (ISOVUE-300) INJECTION 61%
100.0000 mL | Freq: Once | INTRAVENOUS | Status: AC | PRN
  Administered 2017-11-13: 100 mL via INTRAVENOUS

## 2017-11-13 MED ORDER — ONDANSETRON 4 MG PO TBDP: 4.0000 mg | ORAL_TABLET | Freq: Three times a day (TID) | ORAL | 0 refills | Status: AC | PRN

## 2017-11-13 MED ORDER — FAMOTIDINE IN NACL 20-0.9 MG/50ML-% IV SOLN
20.0000 mg | Freq: Once | INTRAVENOUS | Status: AC
  Administered 2017-11-13: 20 mg via INTRAVENOUS
  Filled 2017-11-13: qty 50

## 2017-11-13 MED ORDER — SODIUM CHLORIDE 0.9 % IV BOLUS
1000.0000 mL | Freq: Once | INTRAVENOUS | Status: AC
  Administered 2017-11-13: 1000 mL via INTRAVENOUS

## 2017-11-13 NOTE — ED Notes (Signed)
Patient transported to CT 

## 2017-11-13 NOTE — ED Triage Notes (Signed)
Pt states decreased appetite and abd pain for over a week. Was seen last week for the same. Able to keep food and fluids down. Denies blood in stool.

## 2017-11-13 NOTE — ED Notes (Signed)
Pt returned from CT °

## 2017-11-13 NOTE — Discharge Instructions (Addendum)
Take the prescription as directed.  Increase your fluid intake (ie:  Gatoraide) for the next few days.  Eat a bland diet and advance to your regular diet slowly as you can tolerate it.   Avoid full strength juices, as well as milk and milk products until your diarrhea has resolved.   Call your regular medical doctor on Friday to schedule a follow up appointment within the next week.  Return to the Emergency Department immediately sooner if worsening.

## 2017-11-13 NOTE — ED Provider Notes (Signed)
[facility] EMERGENCY DEPARTMENT Provider Note   CSN: [id number] Arrival date & time: 11/13/17  1200     History   Chief Complaint Chief Complaint  Patient presents with  . Abdominal Pain    HPI Murrell ConverseDustin G Krahenbuhl is a 27 y.o. male.  HPI  Pt was seen at 1300.  Per pt, c/o gradual onset and persistence of constant generalized abd "pain" for the past 1 week.  Had been associated with multiple intermittent episodes of N/V/D for several days last week, which have since resolved.  States he still has decreased appetite and feels generally fatigued.  Describes the abd pain as "cramping."  Denies fevers, no back pain, no rash, no CP/SOB, no black or blood in stools or emesis, no testicular pain/swelling, no dysuria/hematuria.      Past Medical History:  Diagnosis Date  . ADHD     There are no active problems to display for this patient.   Past Surgical History:  Procedure Laterality Date  . APPENDECTOMY          Home Medications    Prior to Admission medications   Medication Sig Start Date End Date Taking? Authorizing Provider  famotidine (PEPCID) 40 MG tablet Take 1 tablet (40 mg total) by mouth daily. 07/17/17  Yes Vanetta MuldersZackowski, Scott, MD    Family History History reviewed. No pertinent family history.  Social History Social History   Tobacco Use  . Smoking status: Current Every Day Smoker    Packs/day: 0.50    Types: Cigarettes  . Smokeless tobacco: Former NeurosurgeonUser    Types: Chew    Quit date: 12/22/2010  Substance Use Topics  . Alcohol use: Yes    Comment: Per p/t social drinker unsure how much per week/month  . Drug use: Yes    Types: Marijuana    Comment: last use 07/16/17     Allergies   Patient has no known allergies.   Review of Systems Review of Systems ROS: Statement: All systems negative except as marked or noted in the HPI; Constitutional: Negative for fever and chills. +generalized fatigue.; ; Eyes: Negative for eye pain, redness and discharge. ; ;  ENMT: Negative for ear pain, hoarseness, nasal congestion, sinus pressure and sore throat. ; ; Cardiovascular: Negative for chest pain, palpitations, diaphoresis, dyspnea and peripheral edema. ; ; Respiratory: Negative for cough, wheezing and stridor. ; ; Gastrointestinal: +abd pain, decreased appetite. Resolved N/V/D. Negative for blood in stool, hematemesis, jaundice and rectal bleeding. . ; ; Genitourinary: Negative for dysuria, flank pain and hematuria. ; ; Genital:  No penile drainage or rash, no testicular pain or swelling, no scrotal rash or swelling. ;; Musculoskeletal: Negative for back pain and neck pain. Negative for swelling and trauma.; ; Skin: Negative for pruritus, rash, abrasions, blisters, bruising and skin lesion.; ; Neuro: Negative for headache, lightheadedness and neck stiffness. Negative for weakness, altered level of consciousness, altered mental status, extremity weakness, paresthesias, involuntary movement, seizure and syncope.       Physical Exam Updated Vital Signs BP 114/74 (BP Location: Right Arm)   Pulse 75   Temp 97.9 F (36.6 C) (Oral)   Resp 14   Ht 5\' 7"  (1.702 m)   Wt 57.2 kg (126 lb)   SpO2 100%   BMI 19.73 kg/m    BP 119/72   Pulse 61   Temp 97.7 F (36.5 C) (Oral)   Resp 14   Ht 5\' 7"  (1.702 m)   Wt 57.2 kg (126 lb)  SpO2 100%   BMI 19.73 kg/m   15:08:26 Orthostatic Vital Signs MW  Orthostatic Lying   BP- Lying: 104/60   Pulse- Lying: 69       Orthostatic Sitting  BP- Sitting: 111/62   Pulse- Sitting: 89       Orthostatic Standing at 0 minutes  BP- Standing at 0 minutes: 108/72   Pulse- Standing at 0 minutes: 71     Physical Exam  1305: Physical examination:  Nursing notes reviewed; Vital signs and O2 SAT reviewed;  Constitutional: Well developed, Well nourished, Well hydrated, In no acute distress; Head:  Normocephalic, atraumatic; Eyes: EOMI, PERRL, No scleral icterus; ENMT: Mouth and pharynx normal, Mucous membranes moist; Neck:  Supple, Full range of motion, No lymphadenopathy; Cardiovascular: Regular rate and rhythm, No gallop; Respiratory: Breath sounds clear & equal bilaterally, No wheezes.  Speaking full sentences with ease, Normal respiratory effort/excursion; Chest: Nontender, Movement normal; Abdomen: Soft, +left sided tenderness to palp. No rebound or guarding. Nondistended, Normal bowel sounds; Genitourinary: No CVA tenderness; Extremities: Peripheral pulses normal, No tenderness, No edema, No calf edema or asymmetry.; Neuro: AA&Ox3, Major CN grossly intact.  Speech clear. No gross focal motor or sensory deficits in extremities.; Skin: Color normal, Warm, Dry.    ED Treatments / Results  Labs (all labs ordered are listed, but only abnormal results are displayed)   EKG None  Radiology   Procedures Procedures (including critical care time)  Medications Ordered in ED Medications  sodium chloride 0.9 % bolus 1,000 mL (0 mLs Intravenous Stopped 11/13/17 1418)  famotidine (PEPCID) IVPB 20 mg premix (0 mg Intravenous Stopped 11/13/17 1347)  sodium chloride 0.9 % bolus 1,000 mL (1,000 mLs Intravenous New Bag/Given 11/13/17 1540)  iopamidol (ISOVUE-300) 61 % injection 100 mL (100 mLs Intravenous Contrast Given 11/13/17 1545)     Initial Impression / Assessment and Plan / ED Course  I have reviewed the triage vital signs and the nursing notes.  Pertinent labs & imaging results that were available during my care of the patient were reviewed by me and considered in my medical decision making (see chart for details).  MDM Reviewed: nursing note, previous chart and vitals Reviewed previous: labs Interpretation: labs and CT scan   Results for orders placed or performed during the hospital encounter of 11/13/17  Lipase, blood  Result Value Ref Range   Lipase 28 11 - 51 U/L  Comprehensive metabolic panel  Result Value Ref Range   Sodium 138 135 - 145 mmol/L   Potassium 3.8 3.5 - 5.1 mmol/L   Chloride 107 98 -  111 mmol/L   CO2 23 22 - 32 mmol/L   Glucose, Bld 95 70 - 99 mg/dL   BUN 13 6 - 20 mg/dL   Creatinine, Ser 6.60 0.61 - 1.24 mg/dL   Calcium 9.3 8.9 - 63.0 mg/dL   Total Protein 7.7 6.5 - 8.1 g/dL   Albumin 4.4 3.5 - 5.0 g/dL   AST 15 15 - 41 U/L   ALT 14 0 - 44 U/L   Alkaline Phosphatase 52 38 - 126 U/L   Total Bilirubin 1.2 0.3 - 1.2 mg/dL   GFR calc non Af Amer >60 >60 mL/min   GFR calc Af Amer >60 >60 mL/min   Anion gap 8 5 - 15  CBC  Result Value Ref Range   WBC 5.5 4.0 - 10.5 K/uL   RBC 4.60 4.22 - 5.81 MIL/uL   Hemoglobin 14.5 13.0 - 17.0 g/dL   HCT  41.7 39.0 - 52.0 %   MCV 90.7 78.0 - 100.0 fL   MCH 31.5 26.0 - 34.0 pg   MCHC 34.8 30.0 - 36.0 g/dL   RDW 16.1 09.6 - 04.5 %   Platelets 200 150 - 400 K/uL  Urinalysis, Routine w reflex microscopic  Result Value Ref Range   Color, Urine YELLOW YELLOW   APPearance CLOUDY (A) CLEAR   Specific Gravity, Urine 1.018 1.005 - 1.030   pH 8.0 5.0 - 8.0   Glucose, UA NEGATIVE NEGATIVE mg/dL   Hgb urine dipstick NEGATIVE NEGATIVE   Bilirubin Urine NEGATIVE NEGATIVE   Ketones, ur NEGATIVE NEGATIVE mg/dL   Protein, ur NEGATIVE NEGATIVE mg/dL   Nitrite NEGATIVE NEGATIVE   Leukocytes, UA NEGATIVE NEGATIVE   Ct Abdomen Pelvis W Contrast Result Date: 11/13/2017 CLINICAL DATA:  Left-sided abdominal pain and nausea, 1 week duration. EXAM: CT ABDOMEN AND PELVIS WITH CONTRAST TECHNIQUE: Multidetector CT imaging of the abdomen and pelvis was performed using the standard protocol following bolus administration of intravenous contrast. CONTRAST:  ISOVUE-300 IOPAMIDOL (ISOVUE-300) INJECTION 61% COMPARISON:  None. FINDINGS: Lower chest: Normal Hepatobiliary: Normal Pancreas: Normal Spleen: Normal Adrenals/Urinary Tract: Adrenal glands are normal. Kidneys are normal. Bladder is normal. Stomach/Bowel: No bowel abnormality is seen. No evidence of obstruction. No inflammatory changes. No other focal findings. Vascular/Lymphatic: Normal  Reproductive: Normal Other: No free fluid or air. Musculoskeletal: Normal IMPRESSION: Normal examination. No abnormality seen to explain the presenting symptoms. Electronically Signed   By: Paulina Fusi M.D.   On: 11/13/2017 16:17    1640:  IVF given for mild orthostasis during VS with improvement. Pt has tol PO well while in the ED without N/V.  No stooling while in the ED.  Abd benign, VSS. Feels better and wants to go home now. Tx symptomatically at this time. Dx and testing d/w pt and family.  Questions answered.  Verb understanding, agreeable to d/c home with outpt f/u.    Final Clinical Impressions(s) / ED Diagnoses   Final diagnoses:  None    ED Discharge Orders    None       Samuel Jester, DO 11/17/17 0403

## 2017-12-06 ENCOUNTER — Emergency Department (HOSPITAL_COMMUNITY)
Admission: EM | Admit: 2017-12-06 | Discharge: 2017-12-06 | Disposition: A | Discharge status: Home / Self Care | Payer: Self-pay | Attending: Emergency Medicine | Admitting: Emergency Medicine

## 2017-12-06 ENCOUNTER — Other Ambulatory Visit: Payer: Self-pay

## 2017-12-06 ENCOUNTER — Encounter (HOSPITAL_COMMUNITY): Payer: Self-pay | Admitting: Emergency Medicine

## 2017-12-06 DIAGNOSIS — F17210 Nicotine dependence, cigarettes, uncomplicated: Secondary | ICD-10-CM | POA: Insufficient documentation

## 2017-12-06 DIAGNOSIS — Z0289 Encounter for other administrative examinations: Secondary | ICD-10-CM

## 2017-12-06 DIAGNOSIS — Z0279 Encounter for issue of other medical certificate: Secondary | ICD-10-CM | POA: Insufficient documentation

## 2017-12-06 DIAGNOSIS — Z79899 Other long term (current) drug therapy: Secondary | ICD-10-CM | POA: Insufficient documentation

## 2017-12-06 NOTE — ED Notes (Signed)
Upon leaving pt asks for mask for work

## 2017-12-06 NOTE — Discharge Instructions (Signed)
He may return to work.  Return to the emergency department for any fevers, abdominal pain, persistent nausea, vomiting or blood in the stool.  [facility].

## 2017-12-06 NOTE — ED Notes (Signed)
PA in to assess 

## 2017-12-06 NOTE — ED Notes (Signed)
Pt was here 7/3 for abd pain N/V  Was out of work Tuesday for similar sx   tried to return to work today  Needs a work excuse

## 2017-12-06 NOTE — ED Triage Notes (Signed)
Patient states he was seen here recently for vomiting and attempted to go back to work but he was told he needs a note from his doctor releasing him back to work. Patient denies any symptoms at this time.

## 2017-12-06 NOTE — ED Provider Notes (Signed)
[facility] EMERGENCY DEPARTMENT Provider Note   CSN: [id number] Arrival date & time: 12/06/17  1240     History   Chief Complaint Chief Complaint  Patient presents with  . Needs Work Note    HPI Scott Russo is a 27 y.o. male.  Scott Russo is a 27 y.o. Male with a history of ADHD and appendectomy, presents to the ED requesting note to return to work.  Patient reports Tuesday of this week he ate something out of his usual at work and afterwards had abdominal cramping, nonbloody diarrhea and a few episodes of nausea and nonbloody emesis.  Patient reports yesterday he had to stay out of work due to persistence of his symptoms, reports they have significantly improved and he was able to eat dinner last night and has had normal p.o. intake today.  With no further episodes of diarrhea or vomiting.  No fevers or chills, no urinary symptoms.  He reports he has been seen for similar symptoms in the past and was prescribed some stomach medicine which she has been taking with improvement.     Past Medical History:  Diagnosis Date  . ADHD     There are no active problems to display for this patient.   Past Surgical History:  Procedure Laterality Date  . APPENDECTOMY          Home Medications    Prior to Admission medications   Medication Sig Start Date End Date Taking? Authorizing Provider  famotidine (PEPCID) 40 MG tablet Take 1 tablet (40 mg total) by mouth daily. 07/17/17   Vanetta Mulders, MD  ondansetron (ZOFRAN ODT) 4 MG disintegrating tablet Take 1 tablet (4 mg total) by mouth every 8 (eight) hours as needed for nausea or vomiting. 11/13/17   Samuel Jester, DO    Family History History reviewed. No pertinent family history.  Social History Social History   Tobacco Use  . Smoking status: Current Every Day Smoker    Packs/day: 0.50    Types: Cigarettes  . Smokeless tobacco: Former Neurosurgeon    Types: Chew    Quit date: 12/22/2010  Substance Use Topics  .  Alcohol use: Yes    Comment: Per p/t social drinker unsure how much per week/month  . Drug use: Yes    Types: Marijuana     Allergies   Patient has no known allergies.   Review of Systems Review of Systems  Constitutional: Negative for chills and fever.  HENT: Negative.   Respiratory: Negative for cough and shortness of breath.   Cardiovascular: Negative for chest pain.  Gastrointestinal: Positive for abdominal pain, diarrhea, nausea and vomiting. Negative for blood in stool.  Genitourinary: Negative for dysuria and frequency.  Skin: Negative for color change and rash.  All other systems reviewed and are negative.    Physical Exam Updated Vital Signs BP 118/66 (BP Location: Right Arm)   Pulse 75   Temp 98.5 F (36.9 C) (Temporal)   Resp 16   Ht 5\' 7"  (1.702 m)   Wt 56.7 kg (125 lb)   SpO2 100%   BMI 19.58 kg/m   Physical Exam  Constitutional: He appears well-developed and well-nourished. No distress.  HENT:  Head: Normocephalic and atraumatic.  Mouth/Throat: Oropharynx is clear and moist.  Eyes: Right eye exhibits no discharge. Left eye exhibits no discharge.  Neck: Neck supple.  Cardiovascular: Normal rate, regular rhythm, normal heart sounds and intact distal pulses.  Pulmonary/Chest: Effort normal and breath sounds normal. No  respiratory distress.  Respirations equal and unlabored, patient able to speak in full sentences, lungs clear to auscultation bilaterally  Abdominal: Soft. Bowel sounds are normal. He exhibits no distension and no mass. There is no tenderness. There is no guarding.  Abdomen soft, nondistended, nontender to palpation in all quadrants without guarding or peritoneal signs  Musculoskeletal: He exhibits no edema or deformity.  Neurological: He is alert. Coordination normal.  Skin: Skin is warm and dry. Capillary refill takes less than 2 seconds. He is not diaphoretic.  Psychiatric: He has a normal mood and affect. His behavior is normal.    Nursing note and vitals reviewed.    ED Treatments / Results  Labs (all labs ordered are listed, but only abnormal results are displayed) Labs Reviewed - No data to display  EKG None  Radiology No results found.  Procedures Procedures (including critical care time)  Medications Ordered in ED Medications - No data to display   Initial Impression / Assessment and Plan / ED Course  I have reviewed the triage vital signs and the nursing notes.  Pertinent labs & imaging results that were available during my care of the patient were reviewed by me and considered in my medical decision making (see chart for details).  Presents requesting note to return to work.  Day and Wednesday he was experiencing symptoms of nausea, vomiting, diarrhea and mild abdominal cramping after eating some unusual food, symptoms most consistent with gastroenteritis.  All of symptoms have since resolved and patient is feeling much better, tolerating p.o. food and fluids.  Normal vitals and abdomen is benign on exam and patient appears well-hydrated.  Note provided for patient to return to work today.  Return precautions discussed.  [facility].  Final Clinical Impressions(s) / ED Diagnoses   Final diagnoses:  Encounter to obtain excuse from work    ED Discharge Orders    None       Dartha LodgeFord, Benjamin Merrihew N, [place] JerseyPA-C 12/06/17 1318    Samuel JesterMcManus, Kathleen, [place]DO 12/08/17 [phone]

## 2018-10-07 ENCOUNTER — Emergency Department (HOSPITAL_COMMUNITY)
Admission: EM | Admit: 2018-10-07 | Discharge: 2018-10-07 | Disposition: A | Discharge status: Home / Self Care | Payer: Self-pay | Attending: Emergency Medicine | Admitting: Emergency Medicine

## 2018-10-07 ENCOUNTER — Encounter (HOSPITAL_COMMUNITY): Payer: Self-pay | Admitting: *Deleted

## 2018-10-07 ENCOUNTER — Other Ambulatory Visit: Payer: Self-pay

## 2018-10-07 DIAGNOSIS — K2900 Acute gastritis without bleeding: Secondary | ICD-10-CM | POA: Insufficient documentation

## 2018-10-07 DIAGNOSIS — F17210 Nicotine dependence, cigarettes, uncomplicated: Secondary | ICD-10-CM | POA: Insufficient documentation

## 2018-10-07 LAB — COMPREHENSIVE METABOLIC PANEL
ALT: 17 U/L (ref 0–44)
AST: 18 U/L (ref 15–41)
Albumin: 5 g/dL (ref 3.5–5.0)
Alkaline Phosphatase: 57 U/L (ref 38–126)
Anion gap: 9 (ref 5–15)
BUN: 10 mg/dL (ref 6–20)
CO2: 24 mmol/L (ref 22–32)
Calcium: 9.6 mg/dL (ref 8.9–10.3)
Chloride: 106 mmol/L (ref 98–111)
Creatinine, Ser: 0.73 mg/dL (ref 0.61–1.24)
GFR calc Af Amer: >60 mL/min (ref >60)
GFR calc non Af Amer: >60 mL/min (ref >60)
Glucose, Bld: 105 mg/dL — ABNORMAL HIGH (ref 70–99)
Potassium: 3.9 mmol/L (ref 3.5–5.1)
Sodium: 139 mmol/L (ref 135–145)
Total Bilirubin: 1.1 mg/dL (ref 0.3–1.2)
Total Protein: 8.2 g/dL — ABNORMAL HIGH (ref 6.5–8.1)

## 2018-10-07 LAB — CBC
HCT: 43.8 % (ref 39.0–52.0)
Hemoglobin: 14.7 g/dL (ref 13.0–17.0)
MCH: 30.8 pg (ref 26.0–34.0)
MCHC: 33.6 g/dL (ref 30.0–36.0)
MCV: 91.8 fL (ref 80.0–100.0)
Platelets: 177 10*3/uL (ref 150–400)
RBC: 4.77 MIL/uL (ref 4.22–5.81)
RDW: 12.7 % (ref 11.5–15.5)
WBC: 7 10*3/uL (ref 4.0–10.5)
nRBC: 0 % (ref 0.0–0.2)

## 2018-10-07 LAB — URINALYSIS, ROUTINE W REFLEX MICROSCOPIC
Bilirubin Urine: NEGATIVE
Glucose, UA: NEGATIVE mg/dL
Hgb urine dipstick: NEGATIVE
Ketones, ur: NEGATIVE mg/dL
Leukocytes,Ua: NEGATIVE
Nitrite: NEGATIVE
Protein, ur: NEGATIVE mg/dL
Specific Gravity, Urine: 1.009 (ref 1.005–1.030)
pH: 7 (ref 5.0–8.0)

## 2018-10-07 LAB — LIPASE, BLOOD: Lipase: 26 U/L (ref 11–51)

## 2018-10-07 MED ORDER — OMEPRAZOLE 20 MG PO CPDR: 20.0000 mg | DELAYED_RELEASE_CAPSULE | Freq: Every day | ORAL | 1 refills | Status: AC

## 2018-10-07 MED ORDER — SODIUM CHLORIDE 0.9% FLUSH: 3.0000 mL | Freq: Once | INTRAVENOUS | Status: DC

## 2018-10-07 MED ORDER — SUCRALFATE 1 G PO TABS: 1.0000 g | ORAL_TABLET | Freq: Four times a day (QID) | ORAL | 0 refills | Status: DC | PRN

## 2018-10-07 NOTE — ED Provider Notes (Signed)
[facility] Emergency Department Provider Note MRN:  [id number]  Arrival date & time: 10/07/18     Chief Complaint   Abdominal Pain   History of Present Illness   Scott Russo is a 28 y.o. year-old male with a history of ADHD presenting to the ED with chief complaint of abdominal pain.  Left upper quadrant pain for 2 days.  This occurred last year, was diagnosed with acid reflux, was prescribed famotidine.  Stopped taking the famotidine last year, tried it today, helped a little bit.  Denies lower abdominal pain, no fever, no nausea, no vomiting, no chest pain or shortness of breath.  Associated with decreased appetite, pain is worse when laying flat.  Review of Systems  A complete 10 system review of systems was obtained and all systems are negative except as noted in the HPI and PMH.   Patient's Health History    Past Medical History:  Diagnosis Date  . ADHD     Past Surgical History:  Procedure Laterality Date  . APPENDECTOMY      No family history on file.  Social History   Socioeconomic History  . Marital status: Single    Spouse name: Not on file  . Number of children: Not on file  . Years of education: Not on file  . Highest education level: Not on file  Occupational History  . Not on file  Social Needs  . Financial resource strain: Not on file  . Food insecurity:    Worry: Not on file    Inability: Not on file  . Transportation needs:    Medical: Not on file    Non-medical: Not on file  Tobacco Use  . Smoking status: Current Every Day Smoker    Packs/day: 0.50    Types: Cigarettes  . Smokeless tobacco: Former Neurosurgeon    Types: Chew    Quit date: 12/22/2010  Substance and Sexual Activity  . Alcohol use: Not Currently    Comment: Per p/t social drinker unsure how much per week/month  . Drug use: Yes    Types: Marijuana    Comment: last used 10/04/18  . Sexual activity: Yes  Lifestyle  . Physical activity:    Days per week: Not on  file    Minutes per session: Not on file  . Stress: Not on file  Relationships  . Social connections:    Talks on phone: Not on file    Gets together: Not on file    Attends religious service: Not on file    Active member of club or organization: Not on file    Attends meetings of clubs or organizations: Not on file    Relationship status: Not on file  . Intimate partner violence:    Fear of current or ex partner: Not on file    Emotionally abused: Not on file    Physically abused: Not on file    Forced sexual activity: Not on file  Other Topics Concern  . Not on file  Social History Narrative  . Not on file     Physical Exam  Vital Signs and Nursing Notes reviewed Vitals:   10/07/18 1231  BP: 120/71  Pulse: 77  Resp: 16  Temp: 97.7 F (36.5 C)  SpO2: 99%    CONSTITUTIONAL: Well-appearing, NAD NEURO:  Alert and oriented x 3, no focal deficits EYES:  eyes equal and reactive ENT/NECK:  no LAD, no JVD CARDIO: Regular rate, well-perfused, normal S1 and  S2 PULM:  CTAB no wheezing or rhonchi GI/GU:  normal bowel sounds, non-distended, non-tender MSK/SPINE:  No gross deformities, no edema SKIN:  no rash, atraumatic PSYCH:  Appropriate speech and behavior  Diagnostic and Interventional Summary    Labs Reviewed  COMPREHENSIVE METABOLIC PANEL - Abnormal; Notable for the following components:      Result Value   Glucose, Bld 105 (*)    Total Protein 8.2 (*)    All other components within normal limits  LIPASE, BLOOD  CBC  URINALYSIS, ROUTINE W REFLEX MICROSCOPIC    No orders to display    Medications  sodium chloride flush (NS) 0.9 % injection 3 mL (has no administration in time range)     Procedures Critical Care  ED Course and Medical Decision Making  I have reviewed the triage vital signs and the nursing notes.  Pertinent labs & imaging results that were available during my care of the patient were reviewed by me and considered in my medical decision  making (see below for details).  Consistent with recurrence of GERD in this 28 year old male with history of the same, has not been taking any acid suppressing medications.  Abdomen is soft and nontender, labs are reassuring, appropriate for trial of PPI with Carafate to be used as needed.  After the discussed management above, the patient was determined to be safe for discharge.  The patient was in agreement with this plan and all questions regarding their care were answered.  ED return precautions were discussed and the patient will return to the ED with any significant worsening of condition.  Elmer SowMichael M. Pilar PlateBero, MD [facility] [email or web address]  Final Clinical Impressions(s) / ED Diagnoses     ICD-10-CM   1. Acute gastritis without hemorrhage, unspecified gastritis type K29.00     ED Discharge Orders         Ordered    omeprazole (PRILOSEC) 20 MG capsule  Daily     10/07/18 1538    sucralfate (CARAFATE) 1 g tablet  4 times daily PRN     10/07/18 1538             Sabas SousBero, Shaul Trautman M, MD 10/07/18 1541

## 2018-10-07 NOTE — Discharge Instructions (Signed)
You were evaluated in the Emergency Department and after careful evaluation, we did not find any emergent condition requiring admission or further testing in the hospital.  Your symptoms today seem to be due to pain related to acid reflux or inflammation of the lining of the stomach.  You can stop taking the famotidine medication.  Please start taking the omeprazole medication daily.  This is a long-acting medicine that can prevent pain in the long-term.  You can also take the Carafate medication up to 4 times a day as needed to help with pain in the short-term.  Please return to the Emergency Department if you experience any worsening of your condition.  We encourage you to follow up with a primary care provider.  Thank you for allowing [place] to be a part of your care.

## 2018-10-07 NOTE — ED Triage Notes (Signed)
Pt c/o LUQ abdominal pain and nausea that started Friday. Denies diarrhea.

## 2018-11-03 ENCOUNTER — Ambulatory Visit (INDEPENDENT_AMBULATORY_CARE_PROVIDER_SITE_OTHER): Payer: Self-pay

## 2018-11-03 ENCOUNTER — Ambulatory Visit
Admission: EM | Admit: 2018-11-03 | Discharge: 2018-11-03 | Disposition: A | Discharge status: Home / Self Care | Payer: Self-pay | Attending: Emergency Medicine | Admitting: Emergency Medicine

## 2018-11-03 ENCOUNTER — Other Ambulatory Visit: Payer: Self-pay

## 2018-11-03 DIAGNOSIS — K2950 Unspecified chronic gastritis without bleeding: Secondary | ICD-10-CM

## 2018-11-03 DIAGNOSIS — R05 Cough: Secondary | ICD-10-CM

## 2018-11-03 DIAGNOSIS — R9389 Abnormal findings on diagnostic imaging of other specified body structures: Secondary | ICD-10-CM

## 2018-11-03 DIAGNOSIS — R109 Unspecified abdominal pain: Secondary | ICD-10-CM

## 2018-11-03 DIAGNOSIS — J9811 Atelectasis: Secondary | ICD-10-CM

## 2018-11-03 MED ORDER — LIDOCAINE VISCOUS HCL 2 % MT SOLN
15.0000 mL | Freq: Once | OROMUCOSAL | Status: AC
  Administered 2018-11-03: 15 mL via ORAL

## 2018-11-03 MED ORDER — ONDANSETRON 4 MG PO TBDP
4.0000 mg | ORAL_TABLET | Freq: Once | ORAL | Status: AC
  Administered 2018-11-03: 4 mg via ORAL

## 2018-11-03 MED ORDER — ALUM & MAG HYDROXIDE-SIMETH 200-200-20 MG/5ML PO SUSP
30.0000 mL | Freq: Once | ORAL | Status: AC
  Administered 2018-11-03: 30 mL via ORAL

## 2018-11-03 MED ORDER — SUCRALFATE 1 G PO TABS: 1.0000 g | ORAL_TABLET | Freq: Four times a day (QID) | ORAL | 0 refills | Status: AC | PRN

## 2018-11-03 NOTE — ED Provider Notes (Addendum)
[facility]   [id number] 11/03/18 Arrival Time: 4098  CC: ABDOMINAL DISCOMFORT  SUBJECTIVE:  Scott Russo is a 28 y.o. male hx significant for ADHD, and appendectomy, who presents with complaint of "stomach ulcers" that began 1 week.  Denies a precipitating event, trauma, close contacts with similar symptoms.  Denies known sick exposure.  Localizes pain to epigastric, and LUQ.  Describes as improving, intermittent, and stabbing in character.  Was seen in the ED a couple of months ago for similar symptoms.  Had a CBC, CMP, lipase, and urinalysis that was unremarkable.  Was diagnosed with acute gastritis and treated with prilosec and carafate, has not been taking consistently.  Worse with eating milk, and cheese.  Reports similar symptoms in the past that improved with famotidine.  Last BM yesterday with diarrhea.  Complains of associated fatigue, nausea, 1 episode of vomiting 3 days ago, decreased appetite, weight loss of 7 lbs over the past year, and 2 episodes of watery diarrhea 1 day ago.  Denies fever, chills, rhinorrhea, congestion, sore throat, cough, chest pain, SOB, constipation, hematochezia, melena, dysuria, difficulty urinating, increased frequency or urgency, flank pain, loss of bowel or bladder function.  Does admit to smoking tobacco and marijuana.   No LMP for male patient.  ROS: As per HPI.  Past Medical History:  Diagnosis Date  . ADHD    Past Surgical History:  Procedure Laterality Date  . APPENDECTOMY     No Known Allergies No current facility-administered medications on file prior to encounter.    Current Outpatient Medications on File Prior to Encounter  Medication Sig Dispense Refill  . famotidine (PEPCID) 40 MG tablet Take 1 tablet (40 mg total) by mouth daily. 14 tablet 1  . omeprazole (PRILOSEC) 20 MG capsule Take 1 capsule (20 mg total) by mouth daily. 30 capsule 1  . ondansetron (ZOFRAN ODT) 4 MG disintegrating tablet Take 1 tablet (4 mg  total) by mouth every 8 (eight) hours as needed for nausea or vomiting. 6 tablet 0   Social History   Socioeconomic History  . Marital status: Single    Spouse name: Not on file  . Number of children: Not on file  . Years of education: Not on file  . Highest education level: Not on file  Occupational History  . Not on file  Social Needs  . Financial resource strain: Not on file  . Food insecurity    Worry: Not on file    Inability: Not on file  . Transportation needs    Medical: Not on file    Non-medical: Not on file  Tobacco Use  . Smoking status: Current Every Day Smoker    Packs/day: 0.50    Types: Cigarettes  . Smokeless tobacco: Former Systems developer    Types: [place] date: 12/22/2010  Substance and Sexual Activity  . Alcohol use: Not Currently    Comment: Per p/t social drinker unsure how much per week/month  . Drug use: Yes    Types: Marijuana    Comment: last used 10/04/18  . Sexual activity: Yes  Lifestyle  . Physical activity    Days per week: Not on file    Minutes per session: Not on file  . Stress: Not on file  Relationships  . Social Herbalist on phone: Not on file    Gets together: Not on file    Attends religious service: Not on file    Active member of club  or organization: Not on file    Attends meetings of clubs or organizations: Not on file    Relationship status: Not on file  . Intimate partner violence    Fear of current or ex partner: Not on file    Emotionally abused: Not on file    Physically abused: Not on file    Forced sexual activity: Not on file  Other Topics Concern  . Not on file  Social History Narrative  . Not on file   No family history on file.   OBJECTIVE:  Vitals:   11/03/18 1153  BP: 122/67  Pulse: 70  Resp: 18  Temp: 98 F (36.7 C)  SpO2: 98%  Weight: 118 lb (53.5 kg)    General appearance: Alert; NAD HEENT: NCAT.  PERRL.  Nares patent without rhinorrhea.  Dentition poor; Oropharynx clear.  Lungs:  Possible crackles over the LL, otherwise CTAB; normal respiratory effort Heart: regular rate and rhythm.  Radial pulses 2+ symmetrical bilaterally Abdomen: soft, non-distended; normal active bowel sounds; non-tender to light and deep palpation; nontender at McBurney's point; negative Murphy's sign; no guarding Back: no CVA tenderness Extremities: no edema; symmetrical with no gross deformities Skin: warm and dry Neurologic: normal gait Psychological: alert and cooperative; anxious mood and affect  DIAGNOSTIC STUDIES: Dg Abd Acute W/chest  Result Date: 11/03/2018 CLINICAL DATA:  Cough.  Abdominal discomfort. EXAM: DG ABDOMEN ACUTE W/ 1V CHEST COMPARISON:  CT 11/13/2017. FINDINGS: Mediastinum hilar structures normal. Mild left base atelectasis/infiltrate. No pleural effusion or pneumothorax. Heart size normal. Soft tissues the abdomen are unremarkable. No bowel distention or free air. Pelvic calcifications consistent phleboliths. No acute bony abnormality. IMPRESSION: 1.  Mild left base atelectasis/infiltrate. 2.  No acute intraabdominal abnormality. Electronically Signed   By: Maisie Fushomas  Register   On: 11/03/2018 13:07    ASSESSMENT & PLAN:  1. Chronic gastritis without bleeding, unspecified gastritis type   2. Abnormal chest x-ray     Meds ordered this encounter  Medications  . AND Linked Order Group   . alum & mag hydroxide-simeth (MAALOX/MYLANTA) 200-200-20 MG/5ML suspension 30 mL   . lidocaine (XYLOCAINE) 2 % viscous mouth solution 15 mL  . ondansetron (ZOFRAN-ODT) disintegrating tablet 4 mg  . sucralfate (CARAFATE) 1 g tablet    Sig: Take 1 tablet (1 g total) by mouth 4 (four) times daily as needed.    Dispense:  30 tablet    Refill:  0    Order Specific Question:   Supervising Provider    Answer:   Eustace MooreELSON, YVONNE SUE [[id number]]    GI cocktail and zofran given in office X-rays not concerning.  I will follow up with you regarding radiologist interpretation if abnormal Take  medications as prescribed.   Carafate refilled.  Take as directed.  Follow up with PCP for further evaluation and management of chronic abdominal discomfort If you experience new or worsening symptoms return or go to ER such as fever, chills, nausea, vomiting, diarrhea, bloody or dark tarry stools, constipation, urinary symptoms, worsening abdominal discomfort, symptoms that do not improve with medications, inability to keep fluids down, etc...  Reviewed expectations re: course of current medical issues. Questions answered. Outlined signs and symptoms indicating need for more acute intervention. Patient verbalized understanding. After Visit Summary given.   ADDENDUM: Called patient regarding radiologist interpretation of possible infiltrate/ atelectasis of LLL.  Patient would like to hold-off on antibiotic treatment, as he denies cough, or fever at this time.  Given strict return and ED  precautions if he becomes symptomatic.        Rennis HardingWurst, Ondria Oswald, PA-C 11/03/18 1404

## 2018-11-03 NOTE — Discharge Instructions (Signed)
GI cocktail and zofran given in office X-rays not concerning.  I will follow up with you regarding radiologist interpretation if abnormal Take medications as prescribed.   Carafate refilled.  Take as directed.  Follow up with PCP for further evaluation and mangement of chronic abdominal discomfort If you experience new or worsening symptoms return or go to ER such as fever, chills, nausea, vomiting, diarrhea, bloody or dark tarry stools, constipation, urinary symptoms, worsening abdominal discomfort, symptoms that do not improve with medications, inability to keep fluids down, etc..Marland Kitchen

## 2018-11-03 NOTE — ED Triage Notes (Signed)
Pt has had abdominal pain off and on for past month, states pain is worse after eating , has also had episodes of vomiting and diarrhea, reports weight loss

## 2019-11-04 IMAGING — CT CT ABD-PELV W/ CM
2 of 3 series · 17 of 46 positions shown, 19 images · IV contrast (iopamidol)
Comparison: None.

CLINICAL DATA: Left-sided abdominal pain and nausea, 1 week
duration.

EXAM:
CT ABDOMEN AND PELVIS WITH CONTRAST
TECHNIQUE: Multidetector CT imaging of the abdomen and pelvis was performed
using the standard protocol following bolus administration of
intravenous contrast.
CONTRAST:  100mL [id number] IOPAMIDOL ([id number]) INJECTION 61%

[Series 2: axial st · axial · 0.66mm/px · z∈[+868,+1228]mm · 14 of 84 slices shown, 16 images]
[im 6/84  soft-tissue]
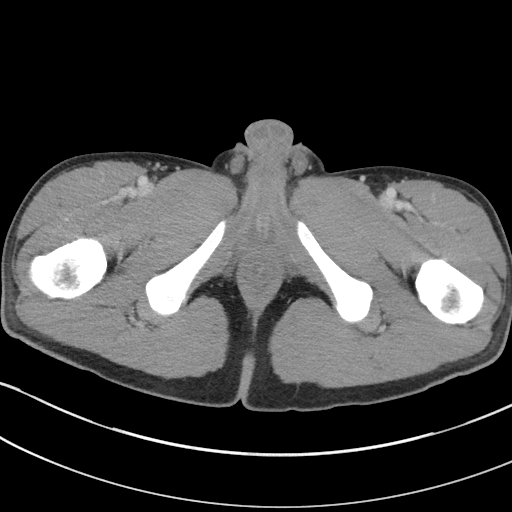
[im 6/84  bone]
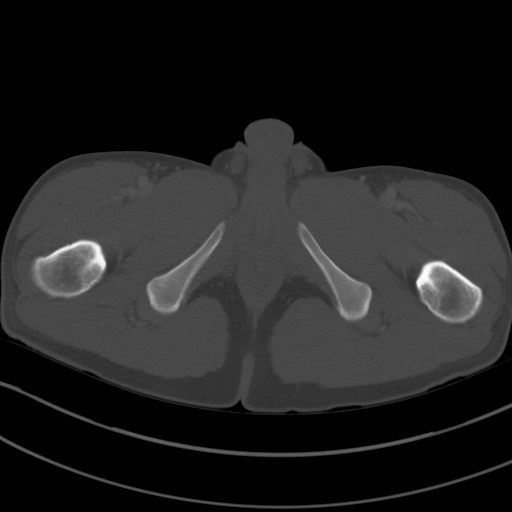
[im 11/84  soft-tissue]
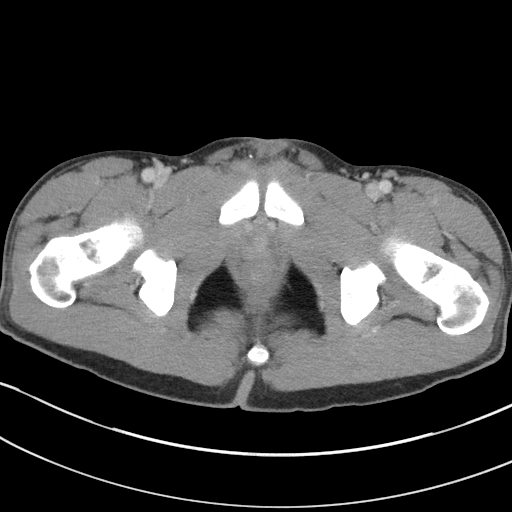
[im 17/84  soft-tissue]
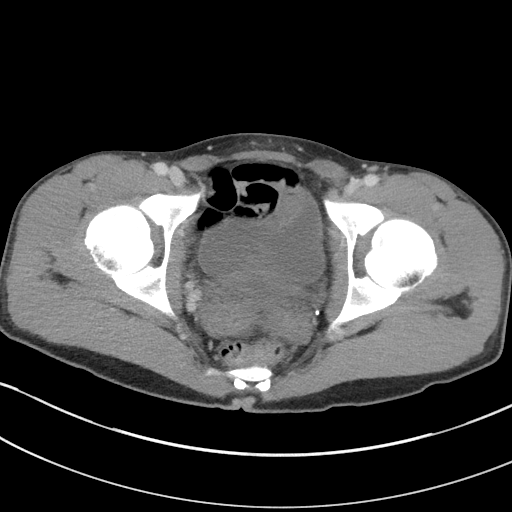
[im 22/84  soft-tissue]
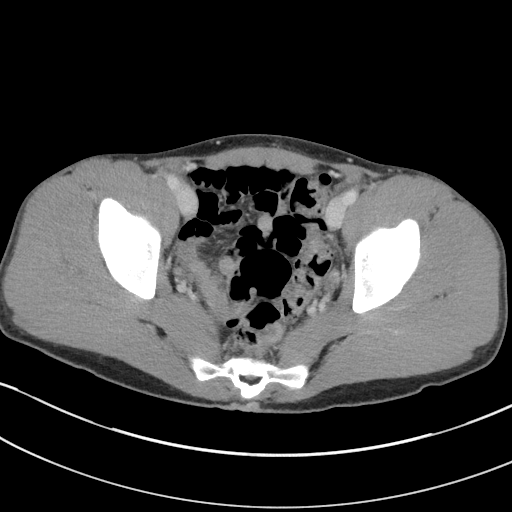
[im 27/84  soft-tissue]
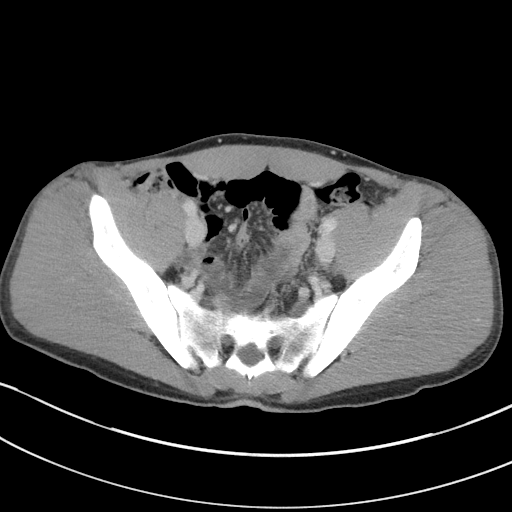
[im 33/84  soft-tissue]
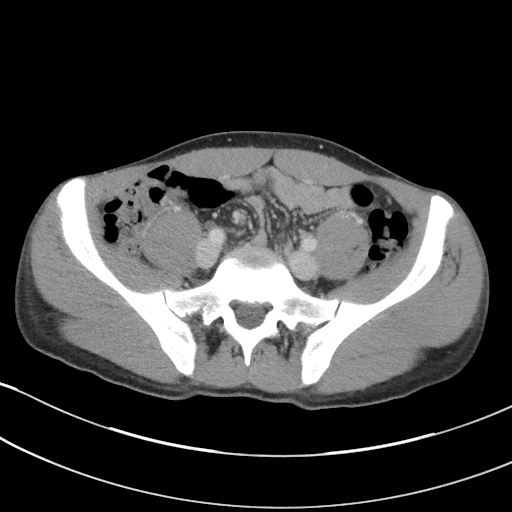
[im 38/84  soft-tissue]
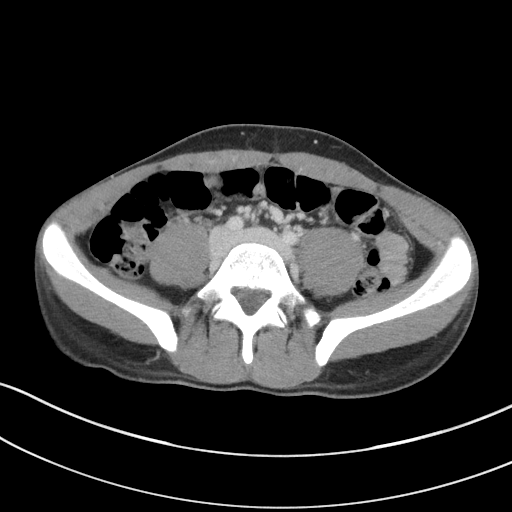
[im 46/84  soft-tissue]
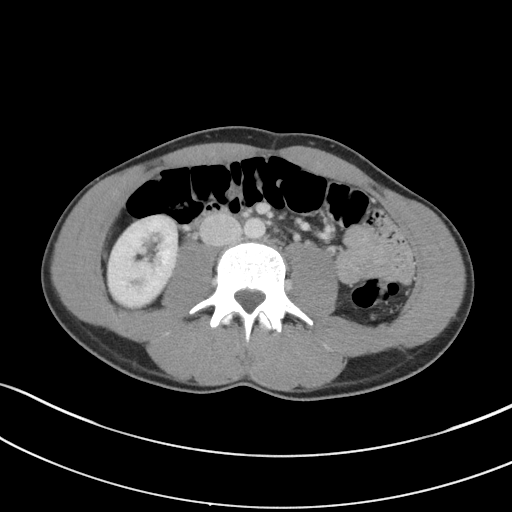
[im 51/84  soft-tissue]
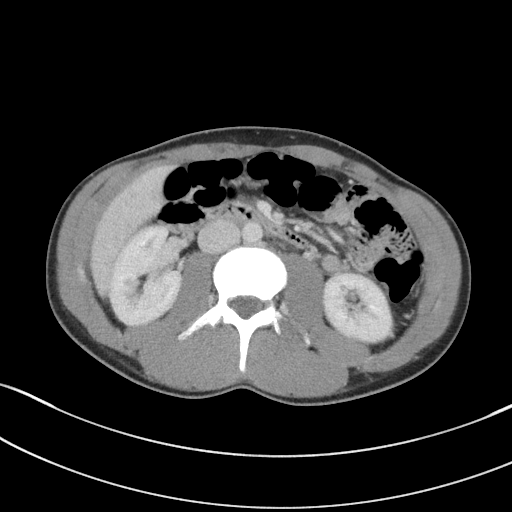
[im 51/84  bone]
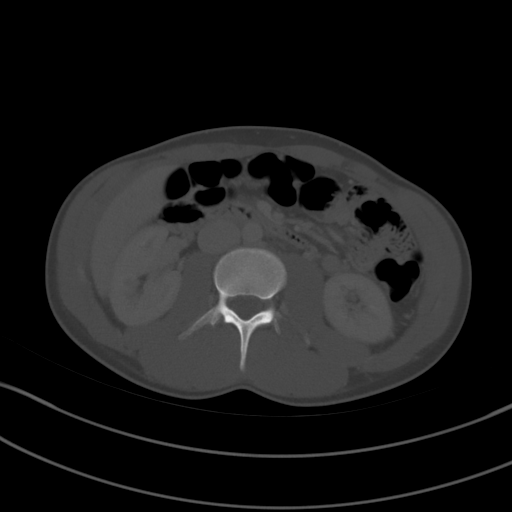
[im 57/84  soft-tissue]
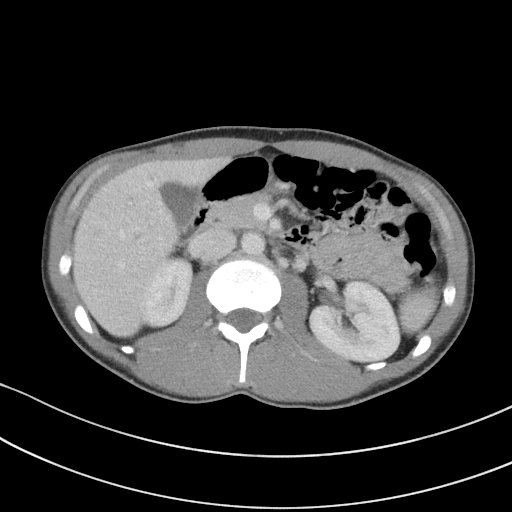
[im 62/84  soft-tissue]
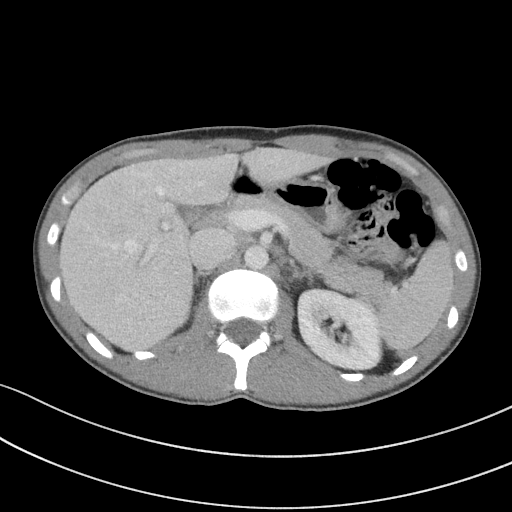
[im 67/84  soft-tissue]
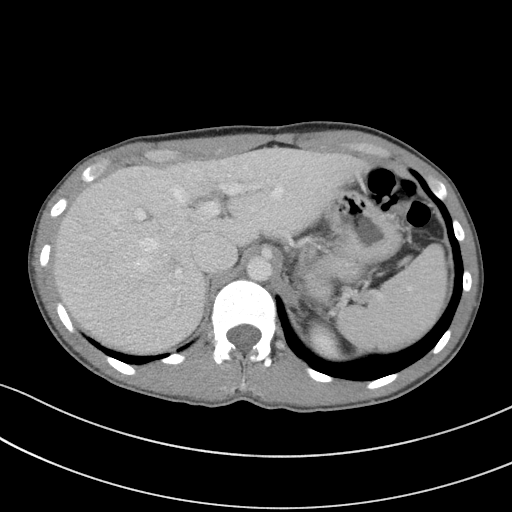
[im 73/84  soft-tissue]
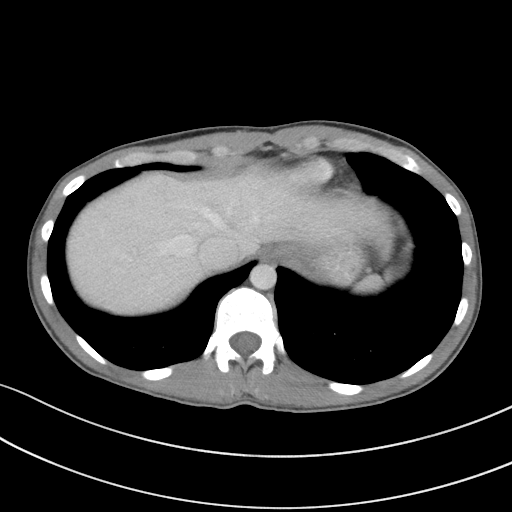
[im 78/84  soft-tissue]
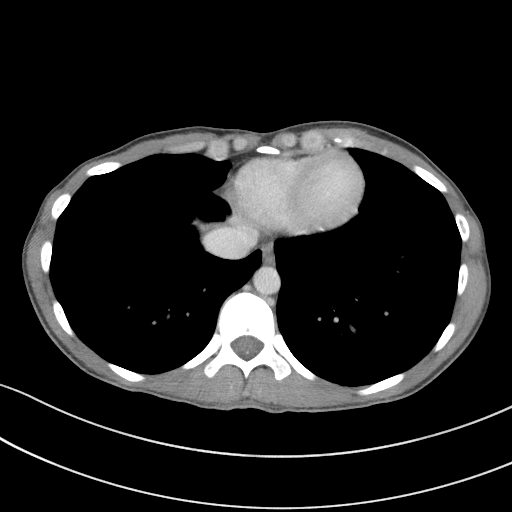

[Series 5: coronal st · coronal · 0.73mm/px · 3 of 66 slices shown]
[im 22/66  soft-tissue]
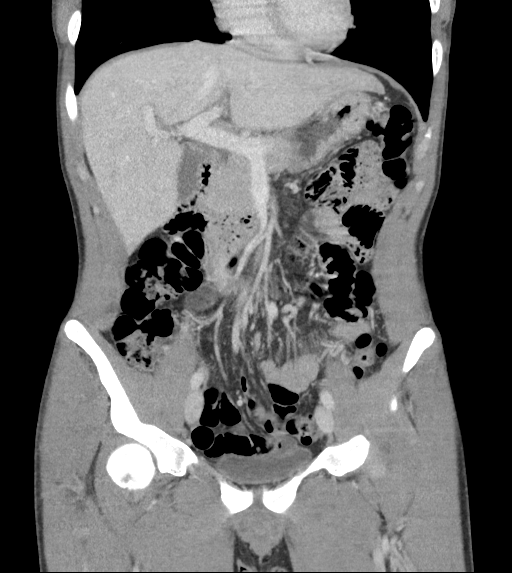
[im 29/66  soft-tissue]
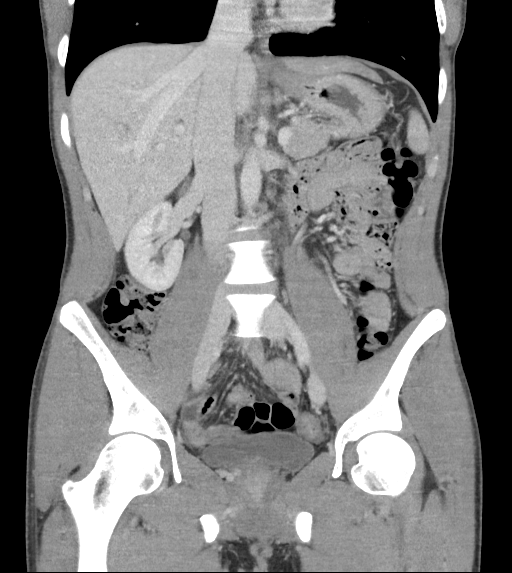
[im 37/66  soft-tissue]
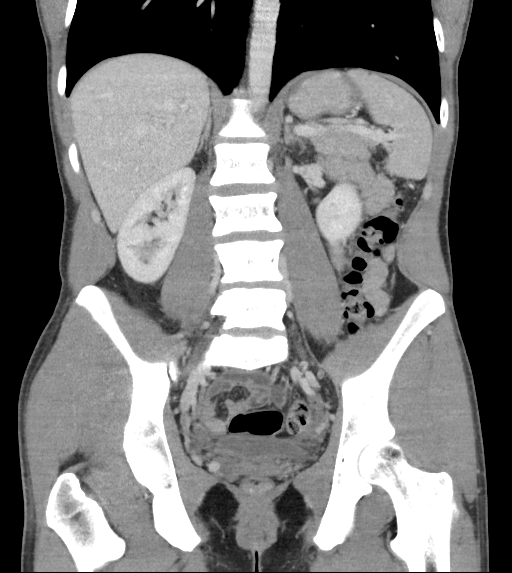

[17 of 46 positions shown; findings below may reference images not displayed]

FINDINGS: Lower chest: Normal

Hepatobiliary: Normal

Pancreas: Normal

Spleen: Normal

Adrenals/Urinary Tract: Adrenal glands are normal. Kidneys are
normal. Bladder is normal.

Stomach/Bowel: No bowel abnormality is seen. No evidence of
obstruction. No inflammatory changes. No other focal findings.

Vascular/Lymphatic: Normal

Reproductive: Normal

Other: No free fluid or air.

Musculoskeletal: Normal
IMPRESSION: Normal examination. No abnormality seen to explain the presenting
symptoms.

## 2020-10-24 IMAGING — DX DG ABDOMEN ACUTE W/ 1V CHEST
3 series · 3 of 3 positions shown · non-contrast
Comparison: CT 11/13/2017.

CLINICAL DATA: Cough.  Abdominal discomfort.

EXAM:
DG ABDOMEN ACUTE W/ 1V CHEST

[chest pa]
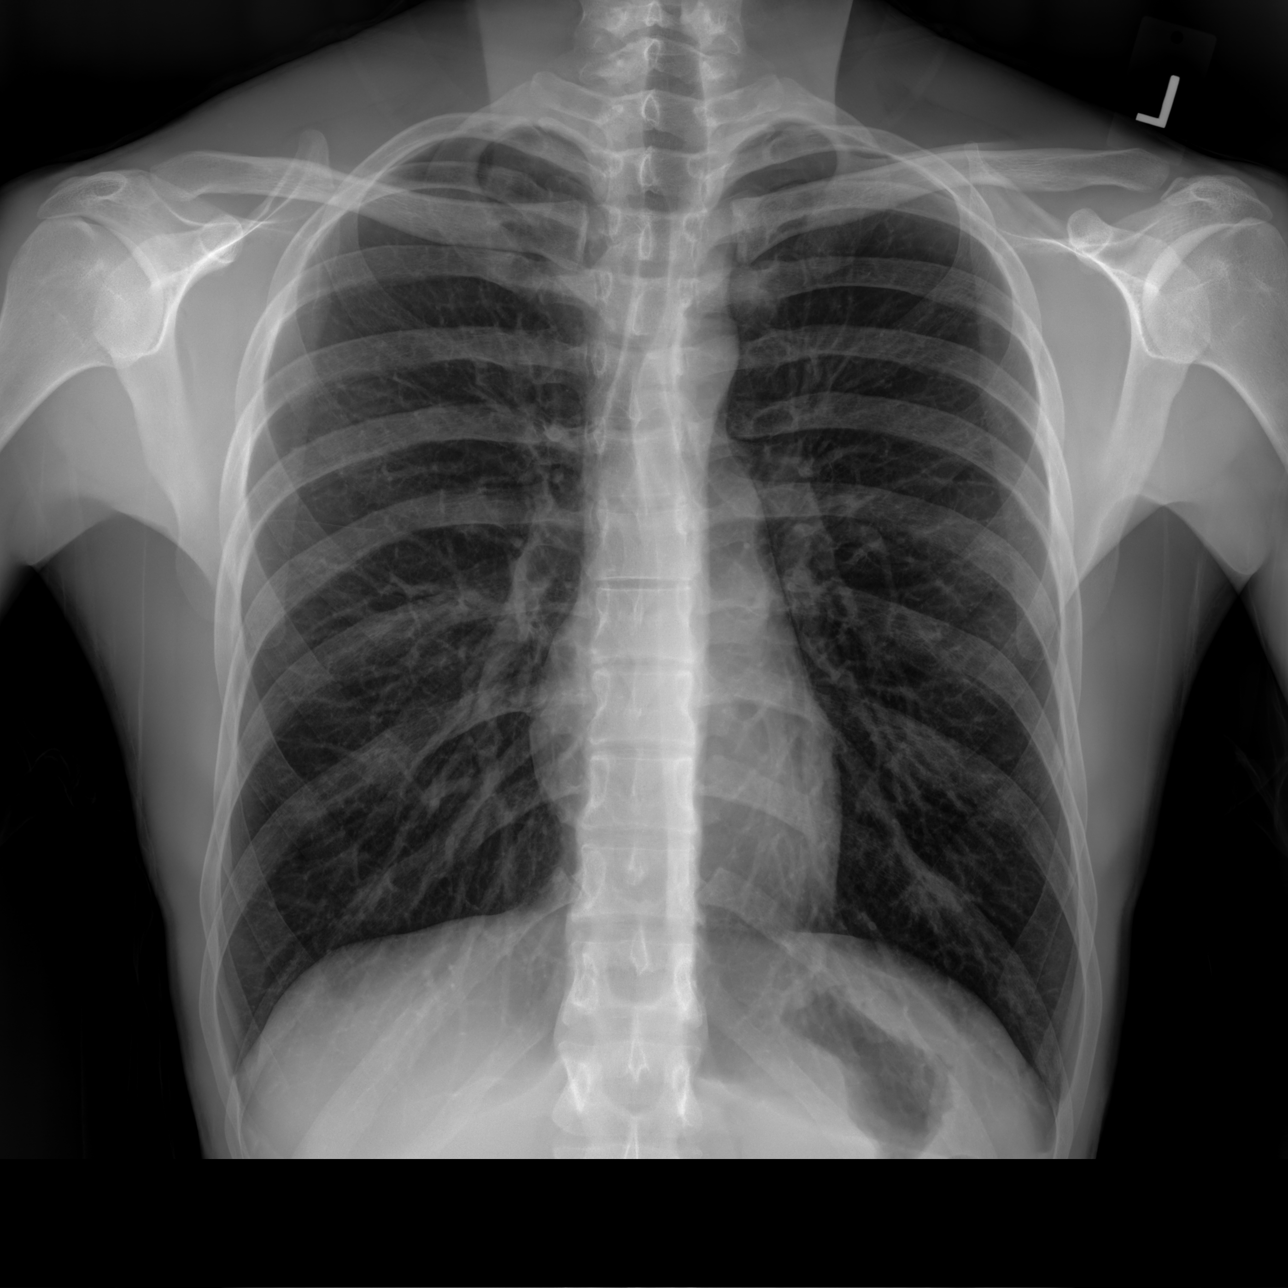

[abdomen standing ap]
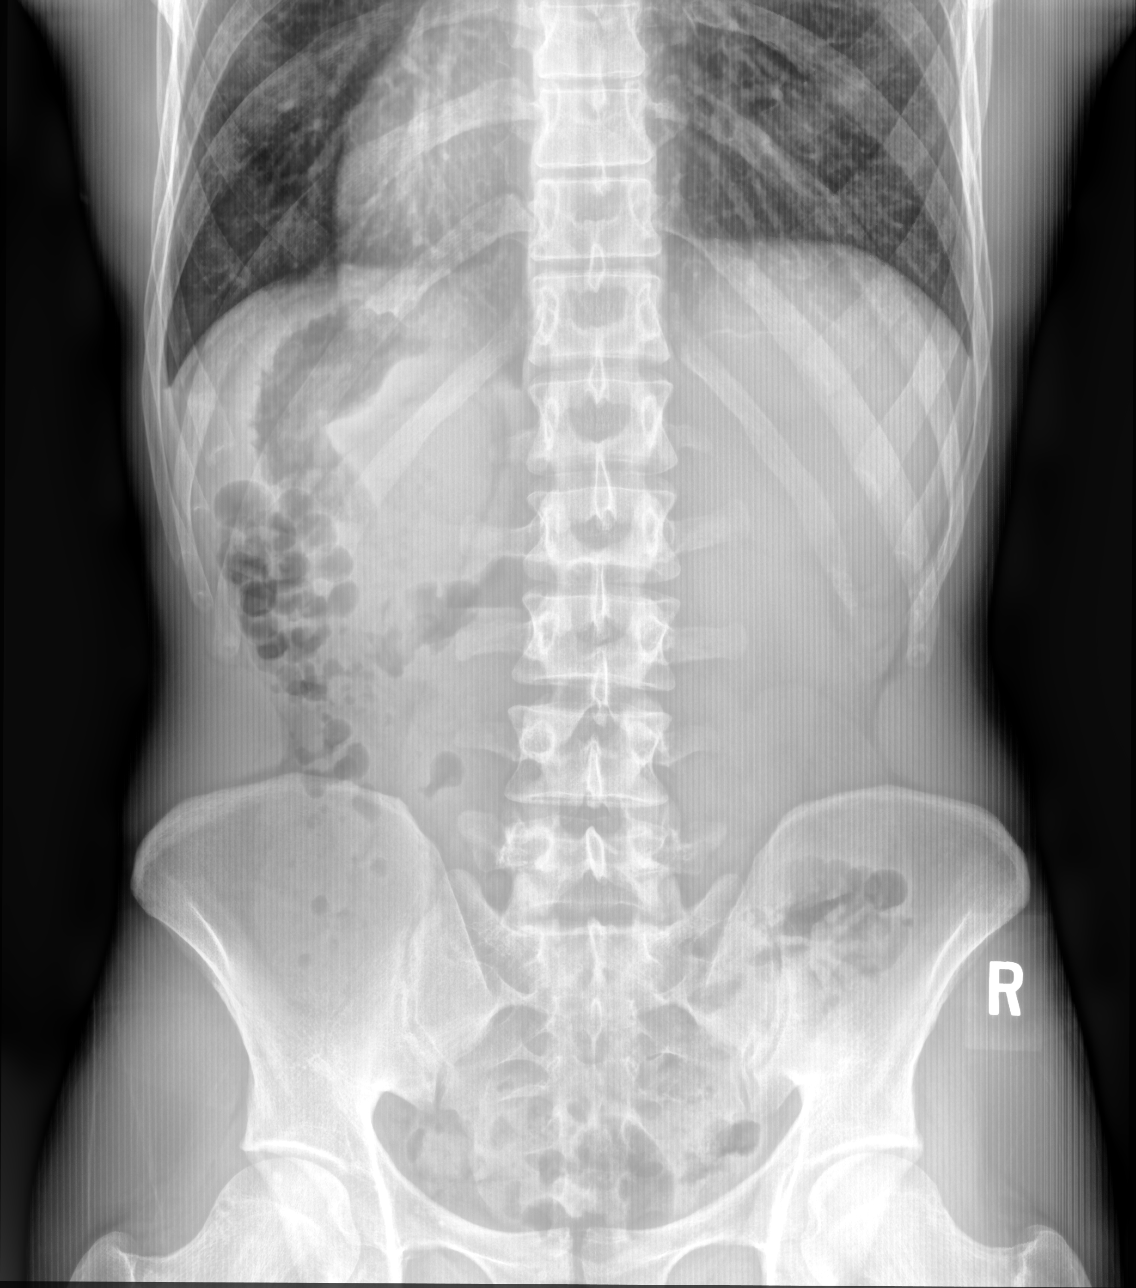

[abdomen supine ap]
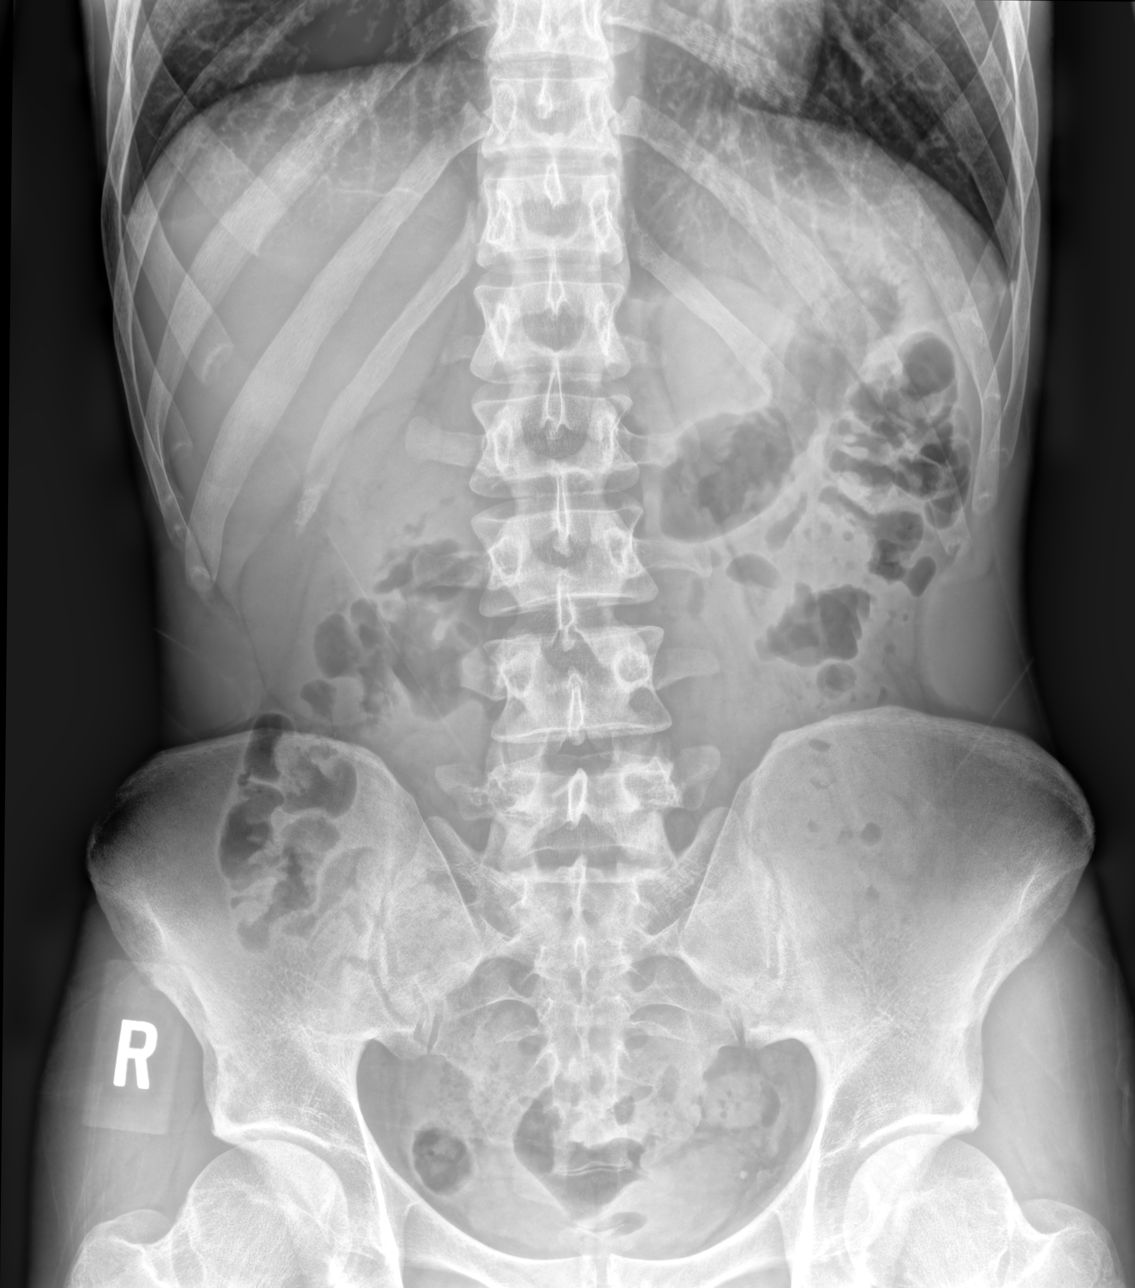

[3 of 3 positions shown; findings below may reference images not displayed]

FINDINGS: Mediastinum hilar structures normal. Mild left base
atelectasis/infiltrate. No pleural effusion or pneumothorax. Heart
size normal.

Soft tissues the abdomen are unremarkable. No bowel distention or
free air. Pelvic calcifications consistent phleboliths. No acute
bony abnormality.
IMPRESSION: 1.  Mild left base atelectasis/infiltrate.

2.  No acute intraabdominal abnormality.

## 2023-10-02 ENCOUNTER — Encounter (HOSPITAL_COMMUNITY): Payer: Self-pay

## 2023-10-02 ENCOUNTER — Emergency Department (HOSPITAL_COMMUNITY)
Admission: EM | Admit: 2023-10-02 | Discharge: 2023-10-02 | Disposition: A | Discharge status: Home / Self Care | Payer: Self-pay | Attending: Emergency Medicine | Admitting: Emergency Medicine

## 2023-10-02 ENCOUNTER — Other Ambulatory Visit: Payer: Self-pay

## 2023-10-02 DIAGNOSIS — H60502 Unspecified acute noninfective otitis externa, left ear: Secondary | ICD-10-CM | POA: Insufficient documentation

## 2023-10-02 DIAGNOSIS — H60312 Diffuse otitis externa, left ear: Secondary | ICD-10-CM

## 2023-10-02 MED ORDER — OFLOXACIN 0.3 % OT SOLN: 5.0000 [drp] | Freq: Two times a day (BID) | OTIC | 0 refills | Status: AC

## 2023-10-02 NOTE — ED Notes (Signed)
 Unable to assess the Pts ear drum in Triage due to cerumen buildup.

## 2023-10-02 NOTE — ED Triage Notes (Signed)
 Pt arrived via POV c/o left ear pain that began this past weekend. Pt reports pain is sharp and reports Hx of problems with his ears. Pt reports having drainage from his ear as well.

## 2023-10-02 NOTE — Discharge Instructions (Signed)
 Floxin eardrops as prescribed, see your doctor in follow-up, ER for severe worsening symptoms

## 2023-10-02 NOTE — ED Provider Notes (Signed)
 [place] EMERG[facility] DEPARTMENT AT [facility] Provider Note   CSN: [id number] Arrival date & time: 10/02/23  1122     History  Chief Complaint  Patient presents with   Otalgia    Scott Russo is a 33 y.o. male.   Otalgia  33 year old male, no significant chronic medical history presents with left-sided ear pain since the weekend, has some pain with touching the ear, some drainage from the ear and some muffled hearing.  Denies any other injuries, no other symptoms, had frequent otitis media as a child    Home Medications Prior to Admission medications   Medication Sig Start Date End Date Taking? Authorizing Provider  ofloxacin (FLOXIN) 0.3 % OTIC solution Place 5 drops into the left ear 2 (two) times daily. 10/02/23  Yes Early Glisson, MD  famotidine  (PEPCID ) 40 MG tablet Take 1 tablet (40 mg total) by mouth daily. 07/17/17   Zackowski, Scott, MD  omeprazole  (PRILOSEC) 20 MG capsule Take 1 capsule (20 mg total) by mouth daily. 10/07/18   Edson Graces, MD  ondansetron  (ZOFRAN  ODT) 4 MG disintegrating tablet Take 1 tablet (4 mg total) by mouth every 8 (eight) hours as needed for nausea or vomiting. 11/13/17   Shara Das, DO  sucralfate  (CARAFATE ) 1 g tablet Take 1 tablet (1 g total) by mouth 4 (four) times daily as needed. 11/03/18   Wurst, [place], PA-C      Allergies    Patient has no known allergies.    Review of Systems   Review of Systems  HENT:  Positive for ear pain.   All other systems reviewed and are negative.   Physical Exam Updated Vital Signs BP 108/79 (BP Location: Right Arm)   Pulse 62   Temp 98 F (36.7 C) (Oral)   Resp 16   Ht 1.676 m (5\' 6" )   Wt 54 kg   SpO2 98%   BMI 19.21 kg/m  Physical Exam Vitals and nursing note reviewed.  Constitutional:      Appearance: He is well-developed. He is not diaphoretic.  HENT:     Head: Normocephalic and atraumatic.     Ears:     Comments: Bilateral ears examined, there is no mastoid  tenderness around the ears, there is no tenderness with manipulation of the auricle or the tragus bilaterally.  The right tympanic membrane is visualized very easily.  The left tympanic membrane is obstructed from edema and slight desquamation on the inside of the external auditory canal.,  No lymphadenopathy of the neck Eyes:     General:        Right eye: No discharge.        Left eye: No discharge.     Conjunctiva/sclera: Conjunctivae normal.  Pulmonary:     Effort: Pulmonary effort is normal. No respiratory distress.  Skin:    General: Skin is warm and dry.     Findings: No erythema or rash.  Neurological:     Mental Status: He is alert.     Coordination: Coordination normal.     ED Results / Procedures / Treatments   Labs (all labs ordered are listed, but only abnormal results are displayed) Labs Reviewed - No data to display  EKG None  Radiology No results found.  Procedures Procedures    Medications Ordered in ED Medications - No data to display  ED Course/ Medical Decision Making/ A&P  Medical Decision Making  Otitis externa, uncomplicated, no signs of mastoiditis, vitals unremarkable Fluoroquinolone topically, home, patient agreeable No need for an ear wick at this time        Final Clinical Impression(s) / ED Diagnoses Final diagnoses:  Acute diffuse otitis externa of left ear    Rx / DC Orders ED Discharge Orders          Ordered    ofloxacin (FLOXIN) 0.3 % OTIC solution  2 times daily        10/02/23 1420              Early Glisson, MD 10/02/23 1420
